# Patient Record
Sex: Female | Born: 1940 | Race: White | Hispanic: No | Smoking: Former smoker
Health system: Southern US, Community
[De-identification: ages and names within clinical notes are randomized; demographics above are authoritative.]

## PROBLEM LIST (undated history)

## (undated) DIAGNOSIS — Z8489 Family history of other specified conditions: Secondary | ICD-10-CM

## (undated) DIAGNOSIS — D649 Anemia, unspecified: Secondary | ICD-10-CM

## (undated) DIAGNOSIS — E7800 Pure hypercholesterolemia, unspecified: Secondary | ICD-10-CM

## (undated) DIAGNOSIS — I10 Essential (primary) hypertension: Secondary | ICD-10-CM

## (undated) DIAGNOSIS — K219 Gastro-esophageal reflux disease without esophagitis: Secondary | ICD-10-CM

## (undated) DIAGNOSIS — M810 Age-related osteoporosis without current pathological fracture: Secondary | ICD-10-CM

## (undated) DIAGNOSIS — F32A Depression, unspecified: Secondary | ICD-10-CM

## (undated) DIAGNOSIS — M1990 Unspecified osteoarthritis, unspecified site: Secondary | ICD-10-CM

## (undated) DIAGNOSIS — E039 Hypothyroidism, unspecified: Secondary | ICD-10-CM

## (undated) DIAGNOSIS — F329 Major depressive disorder, single episode, unspecified: Secondary | ICD-10-CM

## (undated) DIAGNOSIS — I341 Nonrheumatic mitral (valve) prolapse: Secondary | ICD-10-CM

## (undated) DIAGNOSIS — S72001A Fracture of unspecified part of neck of right femur, initial encounter for closed fracture: Secondary | ICD-10-CM

## (undated) HISTORY — PX: ABDOMINAL HYSTERECTOMY: SHX81

## (undated) HISTORY — PX: HERNIA REPAIR: SHX51

## (undated) HISTORY — PX: BREAST SURGERY: SHX581

## (undated) HISTORY — PX: COLONOSCOPY W/ BIOPSIES AND POLYPECTOMY: SHX1376

## (undated) HISTORY — PX: TONSILLECTOMY: SUR1361

---

## 1997-09-17 ENCOUNTER — Ambulatory Visit (HOSPITAL_COMMUNITY): Admission: RE | Admit: 1997-09-17 | Discharge: 1997-09-17 | Payer: Self-pay | Admitting: *Deleted

## 1999-05-03 ENCOUNTER — Encounter: Admission: RE | Admit: 1999-05-03 | Discharge: 1999-05-03 | Payer: Self-pay | Admitting: Obstetrics and Gynecology

## 2000-04-03 ENCOUNTER — Ambulatory Visit (HOSPITAL_COMMUNITY): Admission: RE | Admit: 2000-04-03 | Discharge: 2000-04-03 | Payer: Self-pay | Admitting: *Deleted

## 2001-04-18 ENCOUNTER — Encounter: Admission: RE | Admit: 2001-04-18 | Discharge: 2001-04-18 | Payer: Self-pay | Admitting: Internal Medicine

## 2001-04-18 ENCOUNTER — Encounter: Payer: Self-pay | Admitting: Internal Medicine

## 2001-08-28 ENCOUNTER — Other Ambulatory Visit: Admission: RE | Admit: 2001-08-28 | Discharge: 2001-08-28 | Payer: Self-pay | Admitting: Obstetrics and Gynecology

## 2002-10-06 ENCOUNTER — Encounter: Payer: Self-pay | Admitting: Obstetrics and Gynecology

## 2002-10-08 ENCOUNTER — Encounter (INDEPENDENT_AMBULATORY_CARE_PROVIDER_SITE_OTHER): Payer: Self-pay | Admitting: Specialist

## 2002-10-08 ENCOUNTER — Inpatient Hospital Stay (HOSPITAL_COMMUNITY): Admission: RE | Admit: 2002-10-08 | Discharge: 2002-10-10 | Payer: Self-pay | Admitting: Obstetrics and Gynecology

## 2003-05-25 ENCOUNTER — Ambulatory Visit (HOSPITAL_COMMUNITY): Admission: RE | Admit: 2003-05-25 | Discharge: 2003-05-25 | Payer: Self-pay | Admitting: Obstetrics and Gynecology

## 2009-02-07 ENCOUNTER — Encounter: Admission: RE | Admit: 2009-02-07 | Discharge: 2009-02-07 | Payer: Self-pay | Admitting: Neurological Surgery

## 2009-11-25 ENCOUNTER — Encounter: Admission: RE | Admit: 2009-11-25 | Discharge: 2009-11-25 | Payer: Self-pay | Admitting: Emergency Medicine

## 2010-12-08 NOTE — H&P (Signed)
NAME:  Paula Schultz, Paula Schultz NO.:  [id number]   MEDICAL RECORD NO.:  [id number]                   PATIENT TYPE:  INP   LOCATION:  NA                                   FACILITY:  [facility]   PHYSICIAN:  Malachi Pro. Ambrose Mantle, M.D.              DATE OF BIRTH:  13-Dec-1940   DATE OF ADMISSION:  DATE OF DISCHARGE:                                HISTORY & PHYSICAL   HISTORY OF PRESENT ILLNESS:  The patient is a 70 year old white female, Para  7, 0/7 admitted to the hospital for vaginal hysterectomy and  anterior/posterior repair because of pelvic relaxation that is symptomatic.  This patient is post menopausal. She stated that in February of 2004 that  for almost a year, she had felt a water balloon when her bladder was full  and when she is on her feet. She also had some stress urinary incontinence  with exercise, cough, and sneeze. On examination, she was noted to have a  third degree cystocele and second degree rectocele. She was informed of the  findings and wanted to proceed with vaginal hysterectomy and  anterior/posterior repair. She does not have insurance and she did not want  to proceed with a sling procedure.   ALLERGIES:  Reveals allergies to PENICILLIN.   PAST SURGICAL HISTORY:  She has had two hernia surgeries and one left breast  biopsy.   PAST MEDICAL HISTORY:  She has had no major adult illnesses although she  does have mitral valve prolapse, arthritis, and high blood pressure.   MEDICATIONS:  Include Effexor, Nexium, Univasc, Celebrex, HCTZ, and  Synthroid.   SOCIAL HISTORY:  She does not drink or smoke.   REVIEW OF SYSTEMS:  Noncontributory.   FAMILY HISTORY:  Father died at 78 years of heart problems. Mother died at  36 years of old age and high blood pressure. One brother and two sisters are  living and well.   PHYSICAL EXAMINATION:  GENERAL: A well developed, well nourished  white  female.  VITAL SIGNS: Weight 160 pounds. Blood pressure  144/76.  HEENT: No cranial abnormalities. Extraocular muscles intact. Nose and  pharynx clear.  NECK: Supple. Without thyromegaly.  HEART: Normal size and sounds. No murmurs.  LUNGS:  Clear to auscultation and percussion.  BREAST: Soft without masses.  ABDOMEN: Soft and nontender. No masses are palpable. Liver, spleen, and  kidney are not felt.  GU: The vulva and vagina are clean. There is a third degree cystocele that  protrudes at least to the introitus and maybe slightly beyond with  straining. There is a third degree rectocele. Cervix clean. The uterus is  thought to be posterior and small. For many years, the patient's uterus was  two times normal size and by ultrasound, showed two fibroids. But it feels  small at this point. The adnexa are free.  RECTAL: Examination is negative.   DIAGNOSTIC IMPRESSION:  PAP  smear on September 19, 2002 was within normal  limits.   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   PLAN:  The patient is admitted for vaginal hysterectomy, anterior/posterior  repair. She has been counseled about the risks of surgery including but not  limited to, heart attack, stroke, pulmonary embolism, wound disruption,  hemorrhage with need for re-operation and/or transfusion, fistula formation,  nerve injury, and intestinal obstruction. She understands and agrees to  proceed with surgery. She has been counseled that the surgery will shorten  and narrow her vagina. She has not been sexually active in many years.                                               Malachi Pro. Ambrose Mantle, M.D.    TFH/MEDQ  D:  10/07/2002  T:  10/08/2002  Job:  [id number]

## 2010-12-08 NOTE — Op Note (Signed)
NAME:  Paula Schultz, PARA NO.:  [id number]   MEDICAL RECORD NO.:  [id number]                   PATIENT TYPE:  INP   LOCATION:  X001                                 FACILITY:  [facility]   PHYSICIAN:  Malachi Pro. Ambrose Mantle, M.D.              DATE OF BIRTH:  1941-01-29   DATE OF PROCEDURE:  10/08/2002  DATE OF DISCHARGE:                                 OPERATIVE REPORT   PREOPERATIVE DIAGNOSIS:  Pelvic relaxation with cystocele and rectocele,  symptomatic.   POSTOPERATIVE DIAGNOSIS:  Pelvic relaxation with cystocele and rectocele,  symptomatic.   PROCEDURES:  1. Vaginal hysterectomy.  2. Anterior and posterior repair.   SURGEON:  Malachi Pro. Ambrose Mantle, M.D.   ASSISTANT:  Zenaida Niece, M.D.   ANESTHESIA:  General anesthesia.   DESCRIPTION OF PROCEDURE:  The patient was brought to the operating room and  placed under satisfactory general anesthesia.  He was placed in the  lithotomy position.  The vulva, vagina, perineum, and urethra were prepped  with Betadine solution.  Exam revealed the uterus to be retroverted, normal  size.  The adnexa were free of masses.  There was a third degree rectocele  and cystocele.  The area was draped as a sterile field after a Foley  catheter was inserted to straight drain.  The cervix was drawn into the  operative field with Lahey clamps.  A circumferential injection of a dilute  solution of Neo-Synephrine was made around the cervix.  A circumferential  incision was then made in this area.  The anterior vaginal mucosa was  separated from the cervix and then dissected anteriorly.  I identified the  anterior peritoneum, entered it, and retracted the bladder away.  I then  exposed the posterior cul-de-sac, entered the posterior cul-de-sac with  sharp dissection, clamped, cut, and suture ligated both uterosacral  ligaments, and held them.  I then clamped, cut, and suture ligated the  cardinal ligaments, paracervical tissues,  parametrial tissues, up to close  to the upper pedicles bilaterally.  The uterus was then inverted through the  incision in the cul-de-sac.  One clamp was placed across the remaining  tissue bilaterally.  The uterus was then removed.  I could see part of the  right ovary and did not see the left ovary.  There was no abnormality.  I  doubly suture ligated both upper pedicles and then ran the posterior vaginal  cuff with a suture of 0 Vicryl.  There was still one remaining bleeder that  was suture ligated.  At this point hemostasis appeared adequate.  I  reperitonealized the pelvis with a running suture of #1 Vicryl, including  the anterior peritoneum, the left upper pedicle, the left uterosacral  ligament, posterior cul-de-sac peritoneum, the right uterosacral ligament,  and the right upper pedicle.  I left this long and not tied.  I then placed  a suture through the uterosacral  ligaments bilaterally, incorporating the  posterior peritoneum with this suture and tied it down.  I then tied down  the pursestring suture, closing off the peritoneal cavity.  All sutures were  cut after the uterosacral ligaments were tied together in the midline.  I  dissected the anterior vaginal mucosa all the way to the urethral meatus,  developed the cystocele, placed a Kelly-type tissue through the periurethral  tissues at the urethrovesical angle, and then imbricated the cystocele with  multiple interrupted sutures of 0 Vicryl.  I then closed the anterior  vaginal mucosa after cutting away excessive vaginal mucosa and  reapproximated it in the midline with multiple interrupted figure-of-eight  sutures of 0 Vicryl.  The posterior vaginal mucosa was then cut across.  The  posterior vaginal mucosa was dissected away from the rectocele all the way  to the vaginal cuff.  I developed the rectocele, imbricated it in the  midline with multiple interrupted sutures of 0 Vicryl, cut away redundant  vaginal mucosa, and  closed the vaginal mucosa in the midline with multiple  interrupted figure-of-eight sutures of 0 Vicryl.  I did place a suture  through the levator muscles outside the introitus, tied this down, and then  closed the perineal tissue with a running 3-0 Vicryl suture.  Sponge and  needle counts were correct.  There was no bleeding, urine was clear, and the  patient returned to recovery in satisfactory condition.  EBL was estimated  as less than 200 by the nurse anesthetist.  I estimated it at 250.                                               Malachi Pro. Ambrose Mantle, M.D.    TFH/MEDQ  D:  10/08/2002  T:  10/08/2002  Job:  [id number]

## 2010-12-08 NOTE — Discharge Summary (Signed)
   NAME:  Paula Schultz, MOLDEN NO.:  [id number]   MEDICAL RECORD NO.:  [id number]                   PATIENT TYPE:  INP   LOCATION:  0440                                 FACILITY:  [facility]   PHYSICIAN:  Malachi Pro. Ambrose Mantle, M.D.              DATE OF BIRTH:  1941-07-06   DATE OF ADMISSION:  10/08/2002  DATE OF DISCHARGE:  10/10/2002                                 DISCHARGE SUMMARY   HISTORY OF PRESENT ILLNESS:  This is a 70 year old white female with pelvic  relaxation, cystocele, and rectocele admitted for vaginal hysterectomy, A&P  repair.   HOSPITAL COURSE:  The patient underwent that procedure by Dr. Ambrose Mantle with  Dr. Jackelyn Knife assisting under general anesthesia.  Blood loss was about 250  cc.  The vaginal pack was left at the end of the procedure, it was removed  on the morning of the first postoperative day.  The patient did well.  Her  catheter was removed on the first postoperative day.  She voided well  without difficulty, tolerated a regular diet, passed flatus, ambulated well  without difficulty, and was discharged on the second postoperative day.   LABORATORY DATA:  White blood cell count was 8900, hemoglobin 12.9,  hematocrit 38.3, platelet count 295,000.  Followup hematocrit was 34.4 and  31.5.  There were 80 segs, 13 lymphs, 7 monos.  Comprehensive metabolic  profile was normal.  Urinalysis was negative.  Chest x-ray showed no chest  findings, but a small hiatal hernia.  EKG showed a normal EKG.   FINAL DIAGNOSES:  Pelvic relaxation with symptomatic cystocele and  rectocele.   OPERATION:  Vaginal hysterectomy, A&P repair.   CONDITION ON DISCHARGE:  Improved.   DISCHARGE INSTRUCTIONS:  Our regular discharge instructions.  No vaginal  entrance, no heavy lifting, no strenuous activity.  Call with any  temperature elevation of greater then 100.4 degrees, call with any heavy  vaginal bleeding, call with any unusual problems.   The patient declines  analgesics at discharge.   FOLLOWUP:  The patient is advised to return to the office in two weeks for  followup examination.                                                Malachi Pro. Ambrose Mantle, M.D.    TFH/MEDQ  D:  10/10/2002  T:  10/10/2002  Job:  [id number]

## 2014-08-09 ENCOUNTER — Inpatient Hospital Stay (HOSPITAL_COMMUNITY): Payer: Commercial Managed Care - HMO

## 2014-08-09 ENCOUNTER — Inpatient Hospital Stay (HOSPITAL_COMMUNITY)
Admission: EM | Admit: 2014-08-09 | Discharge: 2014-08-13 | DRG: 481 | Disposition: A | Discharge status: Skilled Nursing Facility | Payer: Commercial Managed Care - HMO | Attending: Internal Medicine | Admitting: Internal Medicine

## 2014-08-09 ENCOUNTER — Encounter (HOSPITAL_COMMUNITY): Payer: Self-pay | Admitting: Emergency Medicine

## 2014-08-09 ENCOUNTER — Emergency Department (HOSPITAL_COMMUNITY): Payer: Commercial Managed Care - HMO

## 2014-08-09 DIAGNOSIS — F418 Other specified anxiety disorders: Secondary | ICD-10-CM | POA: Diagnosis present

## 2014-08-09 DIAGNOSIS — E780 Pure hypercholesterolemia: Secondary | ICD-10-CM | POA: Diagnosis present

## 2014-08-09 DIAGNOSIS — Z419 Encounter for procedure for purposes other than remedying health state, unspecified: Secondary | ICD-10-CM

## 2014-08-09 DIAGNOSIS — M549 Dorsalgia, unspecified: Secondary | ICD-10-CM

## 2014-08-09 DIAGNOSIS — M810 Age-related osteoporosis without current pathological fracture: Secondary | ICD-10-CM | POA: Diagnosis present

## 2014-08-09 DIAGNOSIS — M4856XA Collapsed vertebra, not elsewhere classified, lumbar region, initial encounter for fracture: Secondary | ICD-10-CM | POA: Diagnosis present

## 2014-08-09 DIAGNOSIS — E039 Hypothyroidism, unspecified: Secondary | ICD-10-CM | POA: Diagnosis present

## 2014-08-09 DIAGNOSIS — W1839XA Other fall on same level, initial encounter: Secondary | ICD-10-CM | POA: Diagnosis present

## 2014-08-09 DIAGNOSIS — N390 Urinary tract infection, site not specified: Secondary | ICD-10-CM | POA: Diagnosis present

## 2014-08-09 DIAGNOSIS — M4854XA Collapsed vertebra, not elsewhere classified, thoracic region, initial encounter for fracture: Secondary | ICD-10-CM | POA: Diagnosis present

## 2014-08-09 DIAGNOSIS — E876 Hypokalemia: Secondary | ICD-10-CM | POA: Diagnosis not present

## 2014-08-09 DIAGNOSIS — M79604 Pain in right leg: Secondary | ICD-10-CM

## 2014-08-09 DIAGNOSIS — S7221XA Displaced subtrochanteric fracture of right femur, initial encounter for closed fracture: Secondary | ICD-10-CM | POA: Diagnosis not present

## 2014-08-09 DIAGNOSIS — Z811 Family history of alcohol abuse and dependence: Secondary | ICD-10-CM | POA: Diagnosis not present

## 2014-08-09 DIAGNOSIS — E785 Hyperlipidemia, unspecified: Secondary | ICD-10-CM | POA: Insufficient documentation

## 2014-08-09 DIAGNOSIS — Y92019 Unspecified place in single-family (private) house as the place of occurrence of the external cause: Secondary | ICD-10-CM

## 2014-08-09 DIAGNOSIS — IMO0002 Reserved for concepts with insufficient information to code with codable children: Secondary | ICD-10-CM

## 2014-08-09 DIAGNOSIS — S72001A Fracture of unspecified part of neck of right femur, initial encounter for closed fracture: Secondary | ICD-10-CM | POA: Diagnosis present

## 2014-08-09 DIAGNOSIS — K219 Gastro-esophageal reflux disease without esophagitis: Secondary | ICD-10-CM | POA: Diagnosis present

## 2014-08-09 DIAGNOSIS — Z8249 Family history of ischemic heart disease and other diseases of the circulatory system: Secondary | ICD-10-CM | POA: Diagnosis not present

## 2014-08-09 DIAGNOSIS — I341 Nonrheumatic mitral (valve) prolapse: Secondary | ICD-10-CM | POA: Diagnosis present

## 2014-08-09 DIAGNOSIS — Z139 Encounter for screening, unspecified: Secondary | ICD-10-CM

## 2014-08-09 DIAGNOSIS — D62 Acute posthemorrhagic anemia: Secondary | ICD-10-CM | POA: Diagnosis not present

## 2014-08-09 DIAGNOSIS — I10 Essential (primary) hypertension: Secondary | ICD-10-CM | POA: Diagnosis present

## 2014-08-09 DIAGNOSIS — T148 Other injury of unspecified body region: Secondary | ICD-10-CM

## 2014-08-09 DIAGNOSIS — M25551 Pain in right hip: Secondary | ICD-10-CM | POA: Diagnosis present

## 2014-08-09 HISTORY — DX: Depression, unspecified: F32.A

## 2014-08-09 HISTORY — DX: Unspecified osteoarthritis, unspecified site: M19.90

## 2014-08-09 HISTORY — DX: Nonrheumatic mitral (valve) prolapse: I34.1

## 2014-08-09 HISTORY — DX: Hypothyroidism, unspecified: E03.9

## 2014-08-09 HISTORY — DX: Age-related osteoporosis without current pathological fracture: M81.0

## 2014-08-09 HISTORY — DX: Essential (primary) hypertension: I10

## 2014-08-09 HISTORY — DX: Gastro-esophageal reflux disease without esophagitis: K21.9

## 2014-08-09 HISTORY — DX: Major depressive disorder, single episode, unspecified: F32.9

## 2014-08-09 HISTORY — DX: Pure hypercholesterolemia, unspecified: E78.00

## 2014-08-09 LAB — CBC WITH DIFFERENTIAL/PLATELET
BASOS PCT: 0 % (ref 0–1)
Basophils Absolute: 0 10*3/uL (ref 0.0–0.1)
Eosinophils Absolute: 0.1 10*3/uL (ref 0.0–0.7)
Eosinophils Relative: 1 % (ref 0–5)
HCT: 39 % (ref 36.0–46.0)
Hemoglobin: 13.1 g/dL (ref 12.0–15.0)
Lymphocytes Relative: 6 % — ABNORMAL LOW (ref 12–46)
Lymphs Abs: 1 10*3/uL (ref 0.7–4.0)
MCH: 30.8 pg (ref 26.0–34.0)
MCHC: 33.6 g/dL (ref 30.0–36.0)
MCV: 91.5 fL (ref 78.0–100.0)
Monocytes Absolute: 0.7 10*3/uL (ref 0.1–1.0)
Monocytes Relative: 4 % (ref 3–12)
NEUTROS ABS: 15.9 10*3/uL — AB (ref 1.7–7.7)
NEUTROS PCT: 89 % — AB (ref 43–77)
Platelets: 260 10*3/uL (ref 150–400)
RBC: 4.26 MIL/uL (ref 3.87–5.11)
RDW: 13 % (ref 11.5–15.5)
WBC: 17.7 10*3/uL — ABNORMAL HIGH (ref 4.0–10.5)

## 2014-08-09 LAB — PROTIME-INR
INR: 1.01 (ref 0.00–1.49)
PROTHROMBIN TIME: 13.4 s (ref 11.6–15.2)

## 2014-08-09 LAB — BASIC METABOLIC PANEL
ANION GAP: 10 (ref 5–15)
BUN: 18 mg/dL (ref 6–23)
CHLORIDE: 105 meq/L (ref 96–112)
CO2: 23 mmol/L (ref 19–32)
Calcium: 9.2 mg/dL (ref 8.4–10.5)
Creatinine, Ser: 0.96 mg/dL (ref 0.50–1.10)
GFR calc Af Amer: 66 mL/min — ABNORMAL LOW (ref >90)
GFR calc non Af Amer: 57 mL/min — ABNORMAL LOW (ref >90)
GLUCOSE: 123 mg/dL — AB (ref 70–99)
POTASSIUM: 3.4 mmol/L — AB (ref 3.5–5.1)
Sodium: 138 mmol/L (ref 135–145)

## 2014-08-09 MED ORDER — ACETAMINOPHEN 650 MG RE SUPP: 650.0000 mg | Freq: Four times a day (QID) | RECTAL | Status: DC | PRN

## 2014-08-09 MED ORDER — EZETIMIBE 10 MG PO TABS
10.0000 mg | ORAL_TABLET | Freq: Every day | ORAL | Status: DC
  Administered 2014-08-11 – 2014-08-13 (×3): 10 mg via ORAL
  Filled 2014-08-09 (×4): qty 1

## 2014-08-09 MED ORDER — METOPROLOL TARTRATE 50 MG PO TABS
50.0000 mg | ORAL_TABLET | Freq: Every day | ORAL | Status: DC
  Administered 2014-08-10 – 2014-08-13 (×4): 50 mg via ORAL
  Filled 2014-08-09 (×4): qty 1

## 2014-08-09 MED ORDER — TRANDOLAPRIL 2 MG PO TABS
2.0000 mg | ORAL_TABLET | Freq: Every day | ORAL | Status: DC
  Administered 2014-08-11 – 2014-08-13 (×3): 2 mg via ORAL
  Filled 2014-08-09 (×4): qty 1

## 2014-08-09 MED ORDER — LORAZEPAM 0.5 MG PO TABS
0.2500 mg | ORAL_TABLET | Freq: Every day | ORAL | Status: DC | PRN
  Administered 2014-08-10 (×2): 0.5 mg via ORAL
  Filled 2014-08-09 (×2): qty 1

## 2014-08-09 MED ORDER — ONDANSETRON HCL 4 MG PO TABS: 4.0000 mg | ORAL_TABLET | Freq: Four times a day (QID) | ORAL | Status: DC | PRN

## 2014-08-09 MED ORDER — SENNA 8.6 MG PO TABS
1.0000 | ORAL_TABLET | Freq: Two times a day (BID) | ORAL | Status: DC
  Administered 2014-08-10 – 2014-08-12 (×5): 8.6 mg via ORAL
  Filled 2014-08-09 (×8): qty 1

## 2014-08-09 MED ORDER — HYDROMORPHONE HCL 1 MG/ML IJ SOLN
1.0000 mg | Freq: Once | INTRAMUSCULAR | Status: AC
  Administered 2014-08-09: 1 mg via INTRAVENOUS
  Filled 2014-08-09: qty 1

## 2014-08-09 MED ORDER — PANTOPRAZOLE SODIUM 40 MG PO TBEC
80.0000 mg | DELAYED_RELEASE_TABLET | Freq: Every day | ORAL | Status: DC
  Administered 2014-08-11 – 2014-08-12 (×2): 80 mg via ORAL
  Filled 2014-08-09 (×2): qty 2

## 2014-08-09 MED ORDER — POLYETHYLENE GLYCOL 3350 17 G PO PACK: 17.0000 g | PACK | Freq: Every day | ORAL | Status: DC | PRN

## 2014-08-09 MED ORDER — LEVOTHYROXINE SODIUM 100 MCG PO TABS
100.0000 ug | ORAL_TABLET | Freq: Every day | ORAL | Status: DC
  Administered 2014-08-11 – 2014-08-13 (×3): 100 ug via ORAL
  Filled 2014-08-09 (×5): qty 1

## 2014-08-09 MED ORDER — ACETAMINOPHEN 325 MG PO TABS: 650.0000 mg | ORAL_TABLET | Freq: Four times a day (QID) | ORAL | Status: DC | PRN

## 2014-08-09 MED ORDER — COLESEVELAM HCL 625 MG PO TABS
1875.0000 mg | ORAL_TABLET | Freq: Two times a day (BID) | ORAL | Status: DC
  Administered 2014-08-10 – 2014-08-13 (×6): 1875 mg via ORAL
  Filled 2014-08-09 (×9): qty 3

## 2014-08-09 MED ORDER — ONDANSETRON HCL 4 MG/2ML IJ SOLN: 4.0000 mg | Freq: Four times a day (QID) | INTRAMUSCULAR | Status: DC | PRN

## 2014-08-09 MED ORDER — MORPHINE SULFATE 2 MG/ML IJ SOLN
2.0000 mg | INTRAMUSCULAR | Status: DC | PRN
  Administered 2014-08-10 (×5): 2 mg via INTRAVENOUS
  Filled 2014-08-09 (×5): qty 1

## 2014-08-09 NOTE — ED Notes (Signed)
Gold necklace given to pt's daughter by Sprint Nextel CorporationVenus, EMT.

## 2014-08-09 NOTE — H&P (Addendum)
[facility] History and Physical  Paula Schultz BJY:[id number] DOB: [birth year] DOA: 08/09/2014  Referring physician: Dr. Essie Christine - [facility] PCP: No primary care provider on file.   Chief Complaint: Fall and R hip pain  HPI: Paula Schultz is a 74 y.o. female  18:45 today pt reports losing balance while ambulating in her home today. Denies hitting head. Fell directly onto the wood floor on R hip. Immediately painful. Unable to get up due to pain and R leg not funcitoning. Pain improved w/ IV p[ain medications in ED. Pain is non-radiating and constant. Denies syncope, HA, palpitations.   Review of Systems:  Constitutional:  No weight loss, night sweats, Fevers, chills, fatigue.  HEENT:  No headaches, Difficulty swallowing,Tooth/dental problems,Sore throat,  No sneezing, itching, ear ache, nasal congestion, post nasal drip,  Cardio-vascular:  No chest pain, Orthopnea, PND, swelling in lower extremities, anasarca, dizziness, palpitations  GI:  No heartburn, indigestion, abdominal pain, nausea, vomiting, diarrhea, change in bowel habits, loss of appetite  Resp:   No shortness of breath with exertion or at rest. No excess mucus, no productive cough, No non-productive cough, No coughing up of blood.No change in color of mucus.No wheezing.No chest wall deformity  Skin:  no rash or lesions.  GU:  no dysuria, change in color of urine, no urgency or frequency. No flank pain.  Musculoskeletal:  Per HPI Psych:  No change in mood or affect. No depression or anxiety. No memory loss.   Past Medical History  Diagnosis Date  . Hypertension   . Arthritis   . Osteoporosis   . Hypercholesteremia   . Mitral valve prolapse    No past surgical history on file. Social History:  reports that she has never smoked. She does not have any smokeless tobacco history on file. She reports that she does not drink alcohol or use illicit drugs.  Allergies  Allergen Reactions  . Penicillins     Family  History  Problem Relation Age of Onset  . AAA (abdominal aortic aneurysm) Mother   . Heart attack Father   . Alcohol abuse Father      Prior to Admission medications   Not on File   Physical Exam: Filed Vitals:   08/09/14 2039 08/09/14 2100 08/09/14 2305 08/09/14 2335  BP: 166/81 192/70  156/66  Pulse: 75 80 78 81  Temp: 97.8 F (36.6 C)  97.5 F (36.4 C) 97.6 F (36.4 C)  TempSrc: Oral  Oral   Resp: Height:      Weight:      SpO2: 92% 96% 94% 100%    Wt Readings from Last 3 Encounters:  08/09/14 64.864 kg (143 lb)    General: Appears to be in pain Eyes:  PERRL, normal lids, irises & conjunctiva ENT: Dry mm Neck:  no LAD, masses or thyromegaly Cardiovascular:  RRR, II/VI systolic murmur and mid systolic click. No LE edema. Telemetry:  SR, no arrhythmias  Respiratory:  CTA bilaterally, no w/r/r. Normal respiratory effort. Abdomen:  soft, ntnd Skin:  no rash or induration seen on limited exam Musculoskeletal:  Significant R lateral thigh ttp and swelling. 2+ distal pulses.  Psychiatric:  grossly normal mood and affect, speech fluent and appropriate Neurologic:  grossly non-focal.          Labs on Admission:  Basic Metabolic Panel:  Recent Labs Lab 08/09/14 2201  NA 138  K 3.4*  CL 105  CO2 23  GLUCOSE 123*  BUN 18  CREATININE 0.96  CALCIUM 9.2   Liver Function Tests: No results for input(s): AST, ALT, ALKPHOS, BILITOT, PROT, ALBUMIN in the last 168 hours. No results for input(s): LIPASE, AMYLASE in the last 168 hours. No results for input(s): AMMONIA in the last 168 hours. CBC:  Recent Labs Lab 08/09/14 2201  WBC 17.7*  NEUTROABS 15.9*  HGB 13.1  HCT 39.0  MCV 91.5  PLT 260   Cardiac Enzymes: No results for input(s): CKTOTAL, CKMB, CKMBINDEX, TROPONINI in the last 168 hours.  BNP (last 3 results) No results for input(s): PROBNP in the last 8760 hours. CBG: No results for input(s): GLUCAP in the last 168  hours.  Radiological Exams on Admission: Dg Chest 1 View  08/09/2014   CLINICAL DATA:  Preop for right proximal femur fracture ORIF. History of hypertension and mitral valve prolapse.  EXAM: CHEST - 1 VIEW  COMPARISON:  None.  FINDINGS: There is an opacity that projects over the cardiac silhouette bulging to the right of the right heart border. This is consistent with a large hiatal hernia.  Cardiac silhouette is normal size.  No hilar masses.  Mild linear/reticular opacity in the medial right lung base consistent with atelectasis and/ scarring. Lungs otherwise clear. No pleural effusion or pneumothorax.  Bony thorax is diffusely demineralized.  IMPRESSION: No acute cardiopulmonary disease.   Electronically Signed   By: Amie Portland M.D.   On: 08/09/2014 23:26   Dg Lumbar Spine 2-3 Views  08/09/2014   CLINICAL DATA:  Right hip pain, low back pain  EXAM: LUMBAR SPINE - 2-3 VIEW  COMPARISON:  02/07/2009  FINDINGS: There are 5 nonrib bearing lumbar-type vertebral bodies. There is an S-shaped scoliosis of the thoracolumbar spine.  There is a T11 vertebral body compression fracture with approximately 50% height loss. There is a T12 vertebral body compression fracture with approximately 40% height loss. There is a mild L1 vertebral body compression fracture with approximately 10% height loss.  There is no spondylolysis.  There is 6 mm of retrolisthesis of L2 on L3.  There is degenerative disc disease at L5-S1. There is degenerative disc disease at T11-12, T12-L1 and L1-2.  The SI joints are unremarkable.  IMPRESSION: 1. Age-indeterminate T11 vertebral body compression fracture with approximately 50% height loss. 2. Chronic T12 and L1 vertebral body compression fractures.   Electronically Signed   By: Elige Ko   On: 08/09/2014 22:05   Dg Pelvis 1-2 Views  08/09/2014   CLINICAL DATA:  Pt fell today pain rt hip and lower back  EXAM: PELVIS - 1-2 VIEW  COMPARISON:  02/07/2009  FINDINGS: There is a comminuted  displaced and angulated fracture of the proximal right femur. Fractures intertrochanteric with separate fracture fragments of the lesser and greater trochanters. There is prominent varus angulation. Fracture fragments are displaced is margins 2 cm.  No other fractures. No dislocation. Bones are demineralized. There is soft tissue swelling surrounding the fracture proximal right femur.  IMPRESSION: 1. Comminuted, displaced and angulated intertrochanteric fracture of the proximal right femur. No dislocation.   Electronically Signed   By: Amie Portland M.D.   On: 08/09/2014 21:58   Dg Knee 2 Views Right  08/09/2014   CLINICAL DATA:  Fall today with right leg pain.  Initial encounter.  EXAM: RIGHT KNEE - 1-2 VIEW  COMPARISON:  None.  FINDINGS: There is no evidence of fracture, dislocation, or joint effusion at the knee.  Knee osteoarthritis with mild marginal spurring. Joint narrowing is also mild.  IMPRESSION: No acute findings at the knee.   Electronically Signed   By: Tiburcio PeaJonathan  Watts M.D.   On: 08/09/2014 22:00   Dg Femur, Min 2 Views Right  08/09/2014   CLINICAL DATA:  Status post fall, right hip pain  EXAM: DG FEMUR 2+V*R*  COMPARISON:  None.  FINDINGS: There is a comminuted, angulated and displaced right intertrochanteric fracture. There is no hip dislocation. There is mild tricompartmental osteoarthritis of the right knee. There is no soft tissue abnormality.  IMPRESSION: Comminuted right intertrochanteric fracture without dislocation.   Electronically Signed   By: Elige KoHetal  Patel   On: 08/09/2014 21:58    EKG: ordered   Assessment/Plan Active Problems:   Closed right hip fracture   Essential hypertension   Osteoporosis   Compression fracture   HLD (hyperlipidemia)   Hypothyroidism   GERD (gastroesophageal reflux disease)  R hip fracture: sustained after mechanical fall. Dr Lajoyce Cornersuda consulted and planning for surgery in the am or possibly tonight.  - Admit - NPO  - CXR - EKG - f/u Ortho recs  post op - Morphine PRN pain - PT/OT post op  HTN: elevation likely from pain - continue metoprolol, moexipril,   GERD:  - continue ppi  Osteoporosis - hold home acteonel (Q wkly dosing)  Compression Fractures: Pt evaluated as outpt 2 days ago for lumbar pain from previous injury. They had not yet heard the results. Lumbar film on presentation w/ chronic and possible new fractures.  - intranasal calcitonin - PT/OT  HLD:  - continue Welchol, Zetia  Hypothyroidism:  - continue synthroid  Anxiety:  - continue Ativan PRN  Code Status: FULL DVT Prophylaxis: SCD Family Communication: Daughter Disposition Plan: pending surgical correction and rehab  Glenard Keesling Shela CommonsJ, MD Family Medicine [facility] [email or web address] Password [email or web address]

## 2014-08-09 NOTE — ED Notes (Signed)
Right leg swelling and deformity noted. Pt fell from standing position. 10 morphine and 150 fentanyl given EMS. Reports her leg gave out and wasn't wearing right shoes; no LOC; no head injury. Pulses intact. Lateral rotation. Intense swelling immediately. No pain to pelvis or lower leg.

## 2014-08-09 NOTE — ED Provider Notes (Signed)
CSN: [id number]     Arrival date & time 08/09/14  2028 History   First MD Initiated Contact with Patient 08/09/14 2037     Chief Complaint  Patient presents with  . Leg Injury     (Consider location/radiation/quality/duration/timing/severity/associated sxs/prior Treatment) HPI 74 year old female past medical history of osteoporosis who presents to ED with her daughter after patient fell this evening. Patient was reported to be standing and lost her balance and fell backward onto her bottom. She reports immediate pain to her right upper leg rated 10/10. EMS was called and according to them she was in intense pain and difficult to get into the stretcher as her leg was bent underneath of her. She denies having head injury, LOC, amnesia or pain elsewhere. Pain worse with movement or palpation. Patient [place] and was not seen for evaluation at that time. After having persistent low back pain she was seen by her primary doctor who ordered x-rays which have been done, however the results have not been relayed to them yet. No other complaints. Past Medical History  Diagnosis Date  . Hypertension   . Arthritis   . Osteoporosis   . Hypercholesteremia   . Mitral valve prolapse    No past surgical history on file. No family history on file. History  Substance Use Topics  . Smoking status: Not on file  . Smokeless tobacco: Not on file  . Alcohol Use: Not on file   OB History    No data available     Review of Systems  Constitutional: Negative for fever and chills.  HENT: Negative for congestion, rhinorrhea and sore throat.   Eyes: Negative for visual disturbance.  Respiratory: Negative for cough and shortness of breath.   Cardiovascular: Negative for chest pain, palpitations and leg swelling.  Gastrointestinal: Negative for nausea, vomiting, abdominal pain, diarrhea and constipation.  Genitourinary: Negative for dysuria, hematuria, vaginal bleeding and vaginal  discharge.  Musculoskeletal: Positive for back pain. Negative for neck pain.       R leg pain  Skin: Negative for rash.  Neurological: Negative for weakness and headaches.  All other systems reviewed and are negative.     Allergies  Penicillins  Home Medications   Prior to Admission medications   Not on File   BP 166/81 mmHg  Pulse 75  Temp(Src) 97.8 F (36.6 C) (Oral)  Resp 18  Ht  (1.626 m)  Wt 143 lb (64.864 kg)  BMI 24.53 kg/m2  SpO2 92% Physical Exam  Constitutional: She is oriented to person, place, and time. She appears well-developed and well-nourished. No distress.  HENT:  Head: Normocephalic and atraumatic.  Eyes: Conjunctivae are normal.  Neck: Normal range of motion.  Cardiovascular: Normal rate, regular rhythm, normal heart sounds and intact distal pulses.   No murmur heard. Pulmonary/Chest: Effort normal and breath sounds normal. No respiratory distress. She has no wheezes. She has no rales. She exhibits no tenderness.  Abdominal: Soft. Bowel sounds are normal. She exhibits no distension.  Musculoskeletal:       Right hip: She exhibits decreased range of motion, tenderness and bony tenderness. She exhibits no crepitus and no deformity.       Cervical back: She exhibits no tenderness and no bony tenderness.       Thoracic back: She exhibits no tenderness and no bony tenderness.       Lumbar back: She exhibits tenderness (mild paraspinal).  Leg length discrepancy noted on  R which is 1 inch short RLE - NVI distally with 2+ DP/TP  Neurological: She is alert and oriented to person, place, and time. No cranial nerve deficit.  Skin: Skin is warm and dry.  Psychiatric: She has a normal mood and affect.  Nursing note and vitals reviewed.   ED Course  Procedures (including critical care time) Labs Review Labs Reviewed  CBC WITH DIFFERENTIAL - Abnormal; Notable for the following:    WBC 17.7 (*)    Neutrophils Relative % 89 (*)    Neutro Abs 15.9 (*)     Lymphocytes Relative 6 (*)    All other components within normal limits  BASIC METABOLIC PANEL - Abnormal; Notable for the following:    Potassium 3.4 (*)    Glucose, Bld 123 (*)    GFR calc non Af Amer 57 (*)    GFR calc Af Amer 66 (*)    All other components within normal limits  PROTIME-INR  COMPREHENSIVE METABOLIC PANEL  CBC    Imaging Review Dg Chest 1 View  08/09/2014   CLINICAL DATA:  Preop for right proximal femur fracture ORIF. History of hypertension and mitral valve prolapse.  EXAM: CHEST - 1 VIEW  COMPARISON:  None.  FINDINGS: There is an opacity that projects over the cardiac silhouette bulging to the right of the right heart border. This is consistent with a large hiatal hernia.  Cardiac silhouette is normal size.  No hilar masses.  Mild linear/reticular opacity in the medial right lung base consistent with atelectasis and/ scarring. Lungs otherwise clear. No pleural effusion or pneumothorax.  Bony thorax is diffusely demineralized.  IMPRESSION: No acute cardiopulmonary disease.   Electronically Signed   By: Amie Portlandavid  Ormond M.D.   On: 08/09/2014 23:26   Dg Lumbar Spine 2-3 Views  08/09/2014   CLINICAL DATA:  Right hip pain, low back pain  EXAM: LUMBAR SPINE - 2-3 VIEW  COMPARISON:  02/07/2009  FINDINGS: There are 5 nonrib bearing lumbar-type vertebral bodies. There is an S-shaped scoliosis of the thoracolumbar spine.  There is a T11 vertebral body compression fracture with approximately 50% height loss. There is a T12 vertebral body compression fracture with approximately 40% height loss. There is a mild L1 vertebral body compression fracture with approximately 10% height loss.  There is no spondylolysis.  There is 6 mm of retrolisthesis of L2 on L3.  There is degenerative disc disease at L5-S1. There is degenerative disc disease at T11-12, T12-L1 and L1-2.  The SI joints are unremarkable.  IMPRESSION: 1. Age-indeterminate T11 vertebral body compression fracture with approximately  50% height loss. 2. Chronic T12 and L1 vertebral body compression fractures.   Electronically Signed   By: Elige KoHetal  Patel   On: 08/09/2014 22:05   Dg Pelvis 1-2 Views  08/09/2014   CLINICAL DATA:  Pt fell today pain rt hip and lower back  EXAM: PELVIS - 1-2 VIEW  COMPARISON:  02/07/2009  FINDINGS: There is a comminuted displaced and angulated fracture of the proximal right femur. Fractures intertrochanteric with separate fracture fragments of the lesser and greater trochanters. There is prominent varus angulation. Fracture fragments are displaced is margins 2 cm.  No other fractures. No dislocation. Bones are demineralized. There is soft tissue swelling surrounding the fracture proximal right femur.  IMPRESSION: 1. Comminuted, displaced and angulated intertrochanteric fracture of the proximal right femur. No dislocation.   Electronically Signed   By: Amie Portlandavid  Ormond M.D.   On: 08/09/2014 21:58   Dg Knee  2 Views Right  08/09/2014   CLINICAL DATA:  Fall today with right leg pain.  Initial encounter.  EXAM: RIGHT KNEE - 1-2 VIEW  COMPARISON:  None.  FINDINGS: There is no evidence of fracture, dislocation, or joint effusion at the knee.  Knee osteoarthritis with mild marginal spurring. Joint narrowing is also mild.  IMPRESSION: No acute findings at the knee.   Electronically Signed   By: Tiburcio Pea M.D.   On: 08/09/2014 22:00   Dg Femur, Min 2 Views Right  08/09/2014   CLINICAL DATA:  Status post fall, right hip pain  EXAM: DG FEMUR 2+V*R*  COMPARISON:  None.  FINDINGS: There is a comminuted, angulated and displaced right intertrochanteric fracture. There is no hip dislocation. There is mild tricompartmental osteoarthritis of the right knee. There is no soft tissue abnormality.  IMPRESSION: Comminuted right intertrochanteric fracture without dislocation.   Electronically Signed   By: Elige Ko   On: 08/09/2014 21:58     EKG Interpretation None      MDM   Final diagnoses:  Right leg pain    Pt  presents after mechanical fall. Secondary c/f R hip fx. Pt had xrays of L spine as outpt few days ago and no results yet. Mild paraspinal TTP on exam but no step off or other signs of trauma today. Plain films ordered of RLE and L spine. Pt has comm intertroch fx. Ortho c/s and pt is admitted to hospitalist.  Stable while in ED.  Clinical Impression: 1. Hip fracture, right, closed, initial encounter   2. Right leg pain   3. Back pain   4. Screening     Pt seen in conjunction with Dr. Rachel Moulds, DO [facility] Emergency Medicine Resident - PGY-2      Ames Dura, MD 08/10/14 1610  Gerhard Munch, MD 08/11/14 1208

## 2014-08-09 NOTE — ED Notes (Signed)
Pt returned from xray; pain assessed; vitals stable. Pt wants to wait for more pain meds; will use call bell for them.

## 2014-08-10 ENCOUNTER — Inpatient Hospital Stay (HOSPITAL_COMMUNITY): Payer: Commercial Managed Care - HMO | Admitting: Certified Registered Nurse Anesthetist

## 2014-08-10 ENCOUNTER — Inpatient Hospital Stay (HOSPITAL_COMMUNITY): Payer: Commercial Managed Care - HMO

## 2014-08-10 ENCOUNTER — Encounter (HOSPITAL_COMMUNITY)
Admission: EM | Disposition: A | Discharge status: Skilled Nursing Facility | Payer: Self-pay | Source: Home / Self Care | Attending: Internal Medicine

## 2014-08-10 ENCOUNTER — Encounter (HOSPITAL_COMMUNITY): Payer: Self-pay | Admitting: General Practice

## 2014-08-10 DIAGNOSIS — S72001A Fracture of unspecified part of neck of right femur, initial encounter for closed fracture: Secondary | ICD-10-CM

## 2014-08-10 HISTORY — PX: INTRAMEDULLARY (IM) NAIL INTERTROCHANTERIC: SHX5875

## 2014-08-10 LAB — COMPREHENSIVE METABOLIC PANEL
ALBUMIN: 3.4 g/dL — AB (ref 3.5–5.2)
ALT: 17 U/L (ref 0–35)
ANION GAP: 11 (ref 5–15)
AST: 24 U/L (ref 0–37)
Alkaline Phosphatase: 82 U/L (ref 39–117)
BUN: 15 mg/dL (ref 6–23)
CALCIUM: 9.3 mg/dL (ref 8.4–10.5)
CHLORIDE: 103 meq/L (ref 96–112)
CO2: 25 mmol/L (ref 19–32)
Creatinine, Ser: 0.75 mg/dL (ref 0.50–1.10)
GFR calc non Af Amer: 81 mL/min — ABNORMAL LOW (ref >90)
Glucose, Bld: 174 mg/dL — ABNORMAL HIGH (ref 70–99)
POTASSIUM: 3.4 mmol/L — AB (ref 3.5–5.1)
Sodium: 139 mmol/L (ref 135–145)
TOTAL PROTEIN: 6.8 g/dL (ref 6.0–8.3)
Total Bilirubin: 0.6 mg/dL (ref 0.3–1.2)

## 2014-08-10 LAB — CBC
HCT: 41 % (ref 36.0–46.0)
Hemoglobin: 13.7 g/dL (ref 12.0–15.0)
MCH: 30.5 pg (ref 26.0–34.0)
MCHC: 33.4 g/dL (ref 30.0–36.0)
MCV: 91.3 fL (ref 78.0–100.0)
Platelets: 271 10*3/uL (ref 150–400)
RBC: 4.49 MIL/uL (ref 3.87–5.11)
RDW: 12.9 % (ref 11.5–15.5)
WBC: 11.9 10*3/uL — AB (ref 4.0–10.5)

## 2014-08-10 LAB — SURGICAL PCR SCREEN
MRSA, PCR: NEGATIVE
Staphylococcus aureus: POSITIVE — AB

## 2014-08-10 SURGERY — FIXATION, FRACTURE, INTERTROCHANTERIC, WITH INTRAMEDULLARY ROD
Anesthesia: General | Laterality: Right

## 2014-08-10 MED ORDER — SENNOSIDES-DOCUSATE SODIUM 8.6-50 MG PO TABS: 1.0000 | ORAL_TABLET | Freq: Every evening | ORAL | Status: DC | PRN

## 2014-08-10 MED ORDER — ROCURONIUM BROMIDE 50 MG/5ML IV SOLN
INTRAVENOUS | Status: AC
  Filled 2014-08-10: qty 1

## 2014-08-10 MED ORDER — CALCITONIN (SALMON) 200 UNIT/ACT NA SOLN
1.0000 | Freq: Every day | NASAL | Status: DC
Start: 2014-08-10 — End: 2014-08-13
  Administered 2014-08-11 – 2014-08-13 (×3): 1 via NASAL
  Filled 2014-08-10: qty 3.7

## 2014-08-10 MED ORDER — ACETAMINOPHEN 500 MG PO TABS: 500.0000 mg | ORAL_TABLET | Freq: Four times a day (QID) | ORAL | Status: AC | PRN

## 2014-08-10 MED ORDER — MORPHINE SULFATE 2 MG/ML IJ SOLN: 0.5000 mg | INTRAMUSCULAR | Status: DC | PRN

## 2014-08-10 MED ORDER — EPHEDRINE SULFATE 50 MG/ML IJ SOLN
INTRAMUSCULAR | Status: AC
  Filled 2014-08-10: qty 1

## 2014-08-10 MED ORDER — FENTANYL CITRATE 0.05 MG/ML IJ SOLN
INTRAMUSCULAR | Status: DC | PRN
  Administered 2014-08-10 (×2): 50 ug via INTRAVENOUS

## 2014-08-10 MED ORDER — METOCLOPRAMIDE HCL 10 MG PO TABS: 5.0000 mg | ORAL_TABLET | Freq: Three times a day (TID) | ORAL | Status: DC | PRN

## 2014-08-10 MED ORDER — FENTANYL CITRATE 0.05 MG/ML IJ SOLN
INTRAMUSCULAR | Status: AC
  Filled 2014-08-10: qty 5

## 2014-08-10 MED ORDER — MIDAZOLAM HCL 5 MG/5ML IJ SOLN
INTRAMUSCULAR | Status: DC | PRN
  Administered 2014-08-10: 2 mg via INTRAVENOUS

## 2014-08-10 MED ORDER — PROPOFOL 10 MG/ML IV BOLUS
INTRAVENOUS | Status: AC
  Filled 2014-08-10: qty 20

## 2014-08-10 MED ORDER — HYDROCODONE-ACETAMINOPHEN 5-325 MG PO TABS
1.0000 | ORAL_TABLET | Freq: Four times a day (QID) | ORAL | Status: DC | PRN
  Administered 2014-08-11 – 2014-08-12 (×6): 2 via ORAL
  Filled 2014-08-10 (×6): qty 2

## 2014-08-10 MED ORDER — CLINDAMYCIN PHOSPHATE 600 MG/50ML IV SOLN
600.0000 mg | Freq: Four times a day (QID) | INTRAVENOUS | Status: AC
  Administered 2014-08-10 – 2014-08-11 (×2): 600 mg via INTRAVENOUS
  Filled 2014-08-10 (×2): qty 50

## 2014-08-10 MED ORDER — LIDOCAINE HCL (CARDIAC) 20 MG/ML IV SOLN
INTRAVENOUS | Status: AC
  Filled 2014-08-10: qty 5

## 2014-08-10 MED ORDER — LIDOCAINE HCL (CARDIAC) 20 MG/ML IV SOLN
INTRAVENOUS | Status: DC | PRN
  Administered 2014-08-10: 40 mg via INTRAVENOUS

## 2014-08-10 MED ORDER — EPHEDRINE SULFATE 50 MG/ML IJ SOLN
INTRAMUSCULAR | Status: DC | PRN
  Administered 2014-08-10 (×2): 10 mg via INTRAVENOUS

## 2014-08-10 MED ORDER — FENTANYL CITRATE 0.05 MG/ML IJ SOLN
25.0000 ug | INTRAMUSCULAR | Status: DC | PRN
  Administered 2014-08-10 (×2): 50 ug via INTRAVENOUS

## 2014-08-10 MED ORDER — DOCUSATE SODIUM 100 MG PO CAPS
100.0000 mg | ORAL_CAPSULE | Freq: Two times a day (BID) | ORAL | Status: DC
  Administered 2014-08-10 – 2014-08-12 (×5): 100 mg via ORAL
  Filled 2014-08-10 (×7): qty 1

## 2014-08-10 MED ORDER — ONDANSETRON HCL 4 MG/2ML IJ SOLN
INTRAMUSCULAR | Status: DC | PRN
  Administered 2014-08-10: 4 mg via INTRAVENOUS

## 2014-08-10 MED ORDER — NEOSTIGMINE METHYLSULFATE 10 MG/10ML IV SOLN
INTRAVENOUS | Status: DC | PRN
  Administered 2014-08-10: 3 mg via INTRAVENOUS

## 2014-08-10 MED ORDER — PHENYLEPHRINE HCL 10 MG/ML IJ SOLN
10.0000 mg | INTRAVENOUS | Status: DC | PRN
  Administered 2014-08-10: 2 ug/min via INTRAVENOUS

## 2014-08-10 MED ORDER — ONDANSETRON HCL 4 MG/2ML IJ SOLN: 4.0000 mg | Freq: Four times a day (QID) | INTRAMUSCULAR | Status: DC | PRN

## 2014-08-10 MED ORDER — ACETAMINOPHEN 325 MG PO TABS
650.0000 mg | ORAL_TABLET | Freq: Four times a day (QID) | ORAL | Status: DC | PRN
  Administered 2014-08-11: 650 mg via ORAL
  Filled 2014-08-10: qty 2

## 2014-08-10 MED ORDER — BISACODYL 5 MG PO TBEC
5.0000 mg | DELAYED_RELEASE_TABLET | Freq: Every day | ORAL | Status: DC | PRN
  Filled 2014-08-10: qty 1

## 2014-08-10 MED ORDER — ONDANSETRON HCL 4 MG/2ML IJ SOLN
INTRAMUSCULAR | Status: AC
  Filled 2014-08-10: qty 2

## 2014-08-10 MED ORDER — 0.9 % SODIUM CHLORIDE (POUR BTL) OPTIME
TOPICAL | Status: DC | PRN
  Administered 2014-08-10: 1000 mL

## 2014-08-10 MED ORDER — ONDANSETRON HCL 4 MG/2ML IJ SOLN: 4.0000 mg | Freq: Once | INTRAMUSCULAR | Status: DC | PRN

## 2014-08-10 MED ORDER — MORPHINE SULFATE 2 MG/ML IJ SOLN
1.0000 mg | Freq: Once | INTRAMUSCULAR | Status: AC
  Administered 2014-08-10: 1 mg via INTRAVENOUS
  Filled 2014-08-10: qty 1

## 2014-08-10 MED ORDER — MIDAZOLAM HCL 2 MG/2ML IJ SOLN
INTRAMUSCULAR | Status: AC
  Filled 2014-08-10: qty 2

## 2014-08-10 MED ORDER — MENTHOL 3 MG MT LOZG: 1.0000 | LOZENGE | OROMUCOSAL | Status: DC | PRN

## 2014-08-10 MED ORDER — ACETAMINOPHEN 650 MG RE SUPP: 650.0000 mg | Freq: Four times a day (QID) | RECTAL | Status: DC | PRN

## 2014-08-10 MED ORDER — CLINDAMYCIN PHOSPHATE 900 MG/50ML IV SOLN
INTRAVENOUS | Status: DC | PRN
  Administered 2014-08-10: 900 mg via INTRAVENOUS

## 2014-08-10 MED ORDER — LACTATED RINGERS IV SOLN
INTRAVENOUS | Status: DC
  Administered 2014-08-10 – 2014-08-12 (×3): via INTRAVENOUS

## 2014-08-10 MED ORDER — CHLORHEXIDINE GLUCONATE 4 % EX LIQD
60.0000 mL | Freq: Once | CUTANEOUS | Status: DC
  Filled 2014-08-10: qty 60

## 2014-08-10 MED ORDER — GLYCOPYRROLATE 0.2 MG/ML IJ SOLN
INTRAMUSCULAR | Status: DC | PRN
  Administered 2014-08-10: .4 mg via INTRAVENOUS

## 2014-08-10 MED ORDER — POTASSIUM CHLORIDE CRYS ER 20 MEQ PO TBCR: 40.0000 meq | EXTENDED_RELEASE_TABLET | Freq: Once | ORAL | Status: DC

## 2014-08-10 MED ORDER — SODIUM CHLORIDE 0.9 % IV SOLN
INTRAVENOUS | Status: DC
  Administered 2014-08-10 – 2014-08-11 (×2): via INTRAVENOUS

## 2014-08-10 MED ORDER — METOCLOPRAMIDE HCL 5 MG/ML IJ SOLN: 5.0000 mg | Freq: Three times a day (TID) | INTRAMUSCULAR | Status: DC | PRN

## 2014-08-10 MED ORDER — ASPIRIN EC 325 MG PO TBEC
325.0000 mg | DELAYED_RELEASE_TABLET | Freq: Every day | ORAL | Status: DC
  Administered 2014-08-11 – 2014-08-13 (×3): 325 mg via ORAL
  Filled 2014-08-10 (×4): qty 1

## 2014-08-10 MED ORDER — ROCURONIUM BROMIDE 100 MG/10ML IV SOLN
INTRAVENOUS | Status: DC | PRN
  Administered 2014-08-10: 25 mg via INTRAVENOUS

## 2014-08-10 MED ORDER — CLINDAMYCIN PHOSPHATE 900 MG/50ML IV SOLN
900.0000 mg | INTRAVENOUS | Status: DC
Start: 2014-08-10 — End: 2014-08-10
  Filled 2014-08-10: qty 50

## 2014-08-10 MED ORDER — PHENYLEPHRINE HCL 10 MG/ML IJ SOLN
INTRAMUSCULAR | Status: DC | PRN
  Administered 2014-08-10 (×8): 80 ug via INTRAVENOUS

## 2014-08-10 MED ORDER — LORAZEPAM 0.5 MG PO TABS
0.5000 mg | ORAL_TABLET | Freq: Four times a day (QID) | ORAL | Status: DC | PRN
  Administered 2014-08-11 – 2014-08-13 (×4): 0.5 mg via ORAL
  Filled 2014-08-10 (×5): qty 1

## 2014-08-10 MED ORDER — ONDANSETRON HCL 4 MG PO TABS: 4.0000 mg | ORAL_TABLET | Freq: Four times a day (QID) | ORAL | Status: DC | PRN

## 2014-08-10 MED ORDER — FENTANYL CITRATE 0.05 MG/ML IJ SOLN
INTRAMUSCULAR | Status: AC
  Administered 2014-08-10: 50 ug via INTRAVENOUS
  Filled 2014-08-10: qty 2

## 2014-08-10 MED ORDER — LACTATED RINGERS IV SOLN
INTRAVENOUS | Status: DC | PRN
  Administered 2014-08-10: 17:00:00 via INTRAVENOUS

## 2014-08-10 MED ORDER — PROPOFOL 10 MG/ML IV BOLUS
INTRAVENOUS | Status: DC | PRN
  Administered 2014-08-10: 100 mg via INTRAVENOUS

## 2014-08-10 MED ORDER — SODIUM CHLORIDE 0.9 % IJ SOLN
INTRAMUSCULAR | Status: AC
  Filled 2014-08-10: qty 10

## 2014-08-10 MED ORDER — ALBUMIN HUMAN 5 % IV SOLN
INTRAVENOUS | Status: DC | PRN
  Administered 2014-08-10: 18:00:00 via INTRAVENOUS

## 2014-08-10 MED ORDER — PHENOL 1.4 % MT LIQD: 1.0000 | OROMUCOSAL | Status: DC | PRN

## 2014-08-10 MED ORDER — ASPIRIN EC 325 MG PO TBEC: 325.0000 mg | DELAYED_RELEASE_TABLET | Freq: Every day | ORAL | Status: AC

## 2014-08-10 MED ORDER — MAGNESIUM CITRATE PO SOLN: 1.0000 | Freq: Once | ORAL | Status: AC | PRN

## 2014-08-10 MED ORDER — POTASSIUM CHLORIDE 10 MEQ/100ML IV SOLN
10.0000 meq | INTRAVENOUS | Status: AC
  Administered 2014-08-10 (×2): 10 meq via INTRAVENOUS
  Filled 2014-08-10 (×2): qty 100

## 2014-08-10 SURGICAL SUPPLY — 40 items
BLADE SURG 15 STRL LF DISP TIS (BLADE) ×1 IMPLANT
BLADE SURG 15 STRL SS (BLADE) ×3
BLADE TFNA HELICAL 80 (Anchor) ×1 IMPLANT
BLADE TFNA HELICAL 80MM (Anchor) ×1 IMPLANT
COVER PERINEAL POST (MISCELLANEOUS) ×3 IMPLANT
COVER SURGICAL LIGHT HANDLE (MISCELLANEOUS) ×3 IMPLANT
COVER TABLE BACK 60X90 (DRAPES) ×3 IMPLANT
DRAPE C-ARM 42X72 X-RAY (DRAPES) ×3 IMPLANT
DRAPE STERI IOBAN 125X83 (DRAPES) ×3 IMPLANT
DRSG ADAPTIC 3X8 NADH LF (GAUZE/BANDAGES/DRESSINGS) ×3 IMPLANT
DRSG MEPILEX BORDER 4X4 (GAUZE/BANDAGES/DRESSINGS) ×5 IMPLANT
DRSG MEPILEX BORDER 4X8 (GAUZE/BANDAGES/DRESSINGS) ×3 IMPLANT
ELECT REM PT RETURN 9FT ADLT (ELECTROSURGICAL) ×3
ELECTRODE REM PT RTRN 9FT ADLT (ELECTROSURGICAL) ×1 IMPLANT
EVACUATOR 1/8 PVC DRAIN (DRAIN) IMPLANT
GLOVE BIOGEL PI IND STRL 6.5 (GLOVE) IMPLANT
GLOVE BIOGEL PI IND STRL 7.5 (GLOVE) IMPLANT
GLOVE BIOGEL PI IND STRL 9 (GLOVE) ×1 IMPLANT
GLOVE BIOGEL PI INDICATOR 6.5 (GLOVE) ×2
GLOVE BIOGEL PI INDICATOR 7.5 (GLOVE) ×2
GLOVE BIOGEL PI INDICATOR 9 (GLOVE) ×2
GLOVE ECLIPSE 7.0 STRL STRAW (GLOVE) ×2 IMPLANT
GLOVE ORTHOPEDIC STR SZ6.5 (GLOVE) ×2 IMPLANT
GLOVE SURG ORTHO 9.0 STRL STRW (GLOVE) ×3 IMPLANT
GOWN STRL REUS W/ TWL XL LVL3 (GOWN DISPOSABLE) ×3 IMPLANT
GOWN STRL REUS W/TWL XL LVL3 (GOWN DISPOSABLE) ×9
KIT BASIN OR (CUSTOM PROCEDURE TRAY) ×3 IMPLANT
KIT ROOM TURNOVER OR (KITS) ×3 IMPLANT
LINER BOOT UNIVERSAL DISP (MISCELLANEOUS) ×3 IMPLANT
MANIFOLD NEPTUNE II (INSTRUMENTS) ×3 IMPLANT
NAIL CANN TFNA 9MM/130 DEG TI (Nail) ×2 IMPLANT
NS IRRIG 1000ML POUR BTL (IV SOLUTION) ×3 IMPLANT
PACK GENERAL/GYN (CUSTOM PROCEDURE TRAY) ×3 IMPLANT
PAD ARMBOARD 7.5X6 YLW CONV (MISCELLANEOUS) ×6 IMPLANT
SCREW LOCKING 5.0X34MM (Screw) ×2 IMPLANT
STAPLER VISISTAT 35W (STAPLE) ×2 IMPLANT
SUT VIC AB 2-0 CT1 27 (SUTURE) ×3
SUT VIC AB 2-0 CT1 TAPERPNT 27 (SUTURE) IMPLANT
SUT VIC AB 2-0 CTB1 (SUTURE) IMPLANT
WATER STERILE IRR 1000ML POUR (IV SOLUTION) ×6 IMPLANT

## 2014-08-10 NOTE — Anesthesia Preprocedure Evaluation (Signed)
Anesthesia Evaluation  Patient identified by MRN, date of birth, ID band Patient awake    Reviewed: Allergy & Precautions, NPO status , Patient's Chart, lab work & pertinent test results  Airway Mallampati: II   Neck ROM: full    Dental   Pulmonary former smoker,          Cardiovascular hypertension,     Neuro/Psych Depression    GI/Hepatic GERD-  ,  Endo/Other  Hypothyroidism   Renal/GU      Musculoskeletal  (+) Arthritis -,   Abdominal   Peds  Hematology   Anesthesia Other Findings   Reproductive/Obstetrics                             Anesthesia Physical Anesthesia Plan  ASA: II  Anesthesia Plan: General   Post-op Pain Management:    Induction: Intravenous  Airway Management Planned: Oral ETT  Additional Equipment:   Intra-op Plan:   Post-operative Plan: Extubation in OR  Informed Consent: I have reviewed the patients History and Physical, chart, labs and discussed the procedure including the risks, benefits and alternatives for the proposed anesthesia with the patient or authorized representative who has indicated his/her understanding and acceptance.     Plan Discussed with: CRNA, Anesthesiologist and Surgeon  Anesthesia Plan Comments:         Anesthesia Quick Evaluation

## 2014-08-10 NOTE — Anesthesia Postprocedure Evaluation (Signed)
Anesthesia Post Note  Patient: Paula CouncilJoanne C Whetzel  Procedure(s) Performed: Procedure(s) (LRB): INTRAMEDULLARY (IM) NAIL INTERTROCHANTRIC (Right)  Anesthesia type: General  Patient location: PACU  Post pain: Pain level controlled  Post assessment: Post-op Vital signs reviewed  Last Vitals: BP 148/47 mmHg  Pulse 105  Temp(Src) 36.4 C (Oral)  Resp 20  Ht 5\' 4"  (1.626 m)  Wt 143 lb (64.864 kg)  BMI 24.53 kg/m2  SpO2 96%  Post vital signs: Reviewed  Level of consciousness: sedated  Complications: No apparent anesthesia complications

## 2014-08-10 NOTE — Transfer of Care (Signed)
Immediate Anesthesia Transfer of Care Note  Patient: Paula Schultz  Procedure(s) Performed: Procedure(s): INTRAMEDULLARY (IM) NAIL INTERTROCHANTRIC (Right)  Patient Location: PACU  Anesthesia Type:General  Level of Consciousness: awake, alert  and oriented  Airway & Oxygen Therapy: Patient Spontanous Breathing  Post-op Assessment: Report given to PACU RN  Post vital signs: Reviewed and stable  Complications: No apparent anesthesia complications

## 2014-08-10 NOTE — Consult Note (Signed)
Reason for Consult: Right subtrochanteric hip fracture Referring Physician: ER physician  MARCAYLA BUDGE is an 74 y.o. female.  HPI: Patient is a 74 year old woman who lives at home who has had multiple falls and lumbar compression fractures who fell sustaining a subtrochanteric hip fracture on the right.  Past Medical History  Diagnosis Date  . Hypertension   . Arthritis   . Osteoporosis   . Hypercholesteremia   . Mitral valve prolapse     No past surgical history on file.  Family History  Problem Relation Age of Onset  . AAA (abdominal aortic aneurysm) Mother   . Heart attack Father   . Alcohol abuse Father     Social History:  reports that she has never smoked. She does not have any smokeless tobacco history on file. She reports that she does not drink alcohol or use illicit drugs.  Allergies:  Allergies  Allergen Reactions  . Penicillins     Medications: I have reviewed the patient's current medications.  Results for orders placed or performed during the hospital encounter of 08/09/14 (from the past 48 hour(s))  CBC with Differential     Status: Abnormal   Collection Time: 08/09/14 10:01 PM  Result Value Ref Range   WBC 17.7 (H) 4.0 - 10.5 K/uL   RBC 4.26 3.87 - 5.11 MIL/uL   Hemoglobin 13.1 12.0 - 15.0 g/dL   HCT 39.0 36.0 - 46.0 %   MCV 91.5 78.0 - 100.0 fL   MCH 30.8 26.0 - 34.0 pg   MCHC 33.6 30.0 - 36.0 g/dL   RDW 13.0 11.5 - 15.5 %   Platelets 260 150 - 400 K/uL   Neutrophils Relative % 89 (H) 43 - 77 %   Neutro Abs 15.9 (H) 1.7 - 7.7 K/uL   Lymphocytes Relative 6 (L) 12 - 46 %   Lymphs Abs 1.0 0.7 - 4.0 K/uL   Monocytes Relative 4 3 - 12 %   Monocytes Absolute 0.7 0.1 - 1.0 K/uL   Eosinophils Relative 1 0 - 5 %   Eosinophils Absolute 0.1 0.0 - 0.7 K/uL   Basophils Relative 0 0 - 1 %   Basophils Absolute 0.0 0.0 - 0.1 K/uL  Basic metabolic panel     Status: Abnormal   Collection Time: 08/09/14 10:01 PM  Result Value Ref Range   Sodium 138 135  - 145 mmol/L    Comment: Please note change in reference range.   Potassium 3.4 (L) 3.5 - 5.1 mmol/L    Comment: Please note change in reference range.   Chloride 105 96 - 112 mEq/L   CO2 23 19 - 32 mmol/L   Glucose, Bld 123 (H) 70 - 99 mg/dL   BUN 18 6 - 23 mg/dL   Creatinine, Ser 0.96 0.50 - 1.10 mg/dL   Calcium 9.2 8.4 - 10.5 mg/dL   GFR calc non Af Amer 57 (L) >90 mL/min   GFR calc Af Amer 66 (L) >90 mL/min    Comment: (NOTE) The eGFR has been calculated using the CKD EPI equation. This calculation has not been validated in all clinical situations. eGFR's persistently <90 mL/min signify possible Chronic Kidney Disease.    Anion gap 10 5 - 15  Protime-INR     Status: None   Collection Time: 08/09/14 10:21 PM  Result Value Ref Range   Prothrombin Time 13.4 11.6 - 15.2 seconds   INR 1.01 0.00 - 1.49  Comprehensive metabolic panel     Status:  Abnormal   Collection Time: 08/10/14  5:29 AM  Result Value Ref Range   Sodium 139 135 - 145 mmol/L    Comment: Please note change in reference range.   Potassium 3.4 (L) 3.5 - 5.1 mmol/L    Comment: Please note change in reference range.   Chloride 103 96 - 112 mEq/L   CO2 25 19 - 32 mmol/L   Glucose, Bld 174 (H) 70 - 99 mg/dL   BUN 15 6 - 23 mg/dL   Creatinine, Ser 0.75 0.50 - 1.10 mg/dL   Calcium 9.3 8.4 - 10.5 mg/dL   Total Protein 6.8 6.0 - 8.3 g/dL   Albumin 3.4 (L) 3.5 - 5.2 g/dL   AST 24 0 - 37 U/L   ALT 17 0 - 35 U/L   Alkaline Phosphatase 82 39 - 117 U/L   Total Bilirubin 0.6 0.3 - 1.2 mg/dL   GFR calc non Af Amer 81 (L) >90 mL/min   GFR calc Af Amer >90 >90 mL/min    Comment: (NOTE) The eGFR has been calculated using the CKD EPI equation. This calculation has not been validated in all clinical situations. eGFR's persistently <90 mL/min signify possible Chronic Kidney Disease.    Anion gap 11 5 - 15  CBC     Status: Abnormal   Collection Time: 08/10/14  5:29 AM  Result Value Ref Range   WBC 11.9 (H) 4.0 - 10.5  K/uL   RBC 4.49 3.87 - 5.11 MIL/uL   Hemoglobin 13.7 12.0 - 15.0 g/dL   HCT 41.0 36.0 - 46.0 %   MCV 91.3 78.0 - 100.0 fL   MCH 30.5 26.0 - 34.0 pg   MCHC 33.4 30.0 - 36.0 g/dL   RDW 12.9 11.5 - 15.5 %   Platelets 271 150 - 400 K/uL    Dg Chest 1 View  08/09/2014   CLINICAL DATA:  Preop for right proximal femur fracture ORIF. History of hypertension and mitral valve prolapse.  EXAM: CHEST - 1 VIEW  COMPARISON:  None.  FINDINGS: There is an opacity that projects over the cardiac silhouette bulging to the right of the right heart border. This is consistent with a large hiatal hernia.  Cardiac silhouette is normal size.  No hilar masses.  Mild linear/reticular opacity in the medial right lung base consistent with atelectasis and/ scarring. Lungs otherwise clear. No pleural effusion or pneumothorax.  Bony thorax is diffusely demineralized.  IMPRESSION: No acute cardiopulmonary disease.   Electronically Signed   By: Lajean Manes M.D.   On: 08/09/2014 23:26   Dg Lumbar Spine 2-3 Views  08/09/2014   CLINICAL DATA:  Right hip pain, low back pain  EXAM: LUMBAR SPINE - 2-3 VIEW  COMPARISON:  02/07/2009  FINDINGS: There are 5 nonrib bearing lumbar-type vertebral bodies. There is an S-shaped scoliosis of the thoracolumbar spine.  There is a T11 vertebral body compression fracture with approximately 50% height loss. There is a T12 vertebral body compression fracture with approximately 40% height loss. There is a mild L1 vertebral body compression fracture with approximately 10% height loss.  There is no spondylolysis.  There is 6 mm of retrolisthesis of L2 on L3.  There is degenerative disc disease at L5-S1. There is degenerative disc disease at T11-12, T12-L1 and L1-2.  The SI joints are unremarkable.  IMPRESSION: 1. Age-indeterminate T11 vertebral body compression fracture with approximately 50% height loss. 2. Chronic T12 and L1 vertebral body compression fractures.   Electronically Signed  By: Kathreen Devoid    On: 08/09/2014 22:05   Dg Pelvis 1-2 Views  08/09/2014   CLINICAL DATA:  Pt fell today pain rt hip and lower back  EXAM: PELVIS - 1-2 VIEW  COMPARISON:  02/07/2009  FINDINGS: There is a comminuted displaced and angulated fracture of the proximal right femur. Fractures intertrochanteric with separate fracture fragments of the lesser and greater trochanters. There is prominent varus angulation. Fracture fragments are displaced is margins 2 cm.  No other fractures. No dislocation. Bones are demineralized. There is soft tissue swelling surrounding the fracture proximal right femur.  IMPRESSION: 1. Comminuted, displaced and angulated intertrochanteric fracture of the proximal right femur. No dislocation.   Electronically Signed   By: Lajean Manes M.D.   On: 08/09/2014 21:58   Dg Knee 2 Views Right  08/09/2014   CLINICAL DATA:  Fall today with right leg pain.  Initial encounter.  EXAM: RIGHT KNEE - 1-2 VIEW  COMPARISON:  None.  FINDINGS: There is no evidence of fracture, dislocation, or joint effusion at the knee.  Knee osteoarthritis with mild marginal spurring. Joint narrowing is also mild.  IMPRESSION: No acute findings at the knee.   Electronically Signed   By: Jorje Guild M.D.   On: 08/09/2014 22:00   Dg Femur, Min 2 Views Right  08/09/2014   CLINICAL DATA:  Status post fall, right hip pain  EXAM: DG FEMUR 2+V*R*  COMPARISON:  None.  FINDINGS: There is a comminuted, angulated and displaced right intertrochanteric fracture. There is no hip dislocation. There is mild tricompartmental osteoarthritis of the right knee. There is no soft tissue abnormality.  IMPRESSION: Comminuted right intertrochanteric fracture without dislocation.   Electronically Signed   By: Kathreen Devoid   On: 08/09/2014 21:58    Review of Systems  All other systems reviewed and are negative.  Blood pressure 156/66, pulse 81, temperature 97.6 F (36.4 C), temperature source Oral, resp. rate 20, height 5' 4"  (1.626 m), weight  64.864 kg (143 lb), SpO2 100 %. Physical Exam  Examination patient's right lower extremity it is shortened and internally rotated. Radiographs shows a comminuted subtrochanteric hip fracture on the right. She also has multiple compression fractures of her thoracic and lumbar spine which by report are chronic. Assessment/Plan: Assessment: Comminuted subtrochanteric right hip fracture.  Plan: We will plan for intramedullary nail fixation of the right subtrochanteric hip fracture. Discussed with the patient and her daughter the need for skilled nursing placement postoperatively. Risks and benefits were discussed patient and her daughter state they understand and wish to proceed at this time.  DUDA,MARCUS V 08/10/2014, 6:34 AM

## 2014-08-10 NOTE — Progress Notes (Signed)
Patient ID: Paula Schultz, female   DOB: 08-19-1940, 74 y.o.   MRN: [id number]  [facility] PROGRESS NOTE  Paula Schultz JXB:[id number] DOB: 05-11-1941 DOA: 08/09/2014 PCP: Blane Ohara, MD   Brief narrative:    74 y.o. Female presented to [facility] ED after an episode of fall at home, hitting her right hip and subsequently unable to stand up and bear weight. In ED, XRAY of the right hip notable for fracture. Ortho consulted and [facility] asked to admit for further management.   Assessment/Plan:    Active Problems:   Closed right hip fracture - still with pain this AM - plan to take to OR today, appreciate ortho's team assistance  - continue to provide analgesia as needed    Hypokalemia - supplement via IV route and repeat BMP in AM   Leukocytosis - secondary to fall and fracture - no clear infectious etiology, no sings of PNA on CXR - WBC trending down since admission - CBC in AM   Essential hypertension - reasonable inpatient control  - continue metoprolol and trandolapril    Compression fractures in thoracic and lumbar spine - chronic in nature - will need PT evaluation once able to tolerate    HLD (hyperlipidemia) - continue ezetimibe    Hypothyroidism - continue synthroid    GERD (gastroesophageal reflux disease) - continue Protonix   Code Status: Full.  Family Communication:  plan of care discussed with the patient and daughter at bedside  Disposition Plan: Home when stable.   IV access:  Peripheral IV Procedures and diagnostic studies:     Dg Chest 1 View  08/09/2014  No acute cardiopulmonary disease.     Lumbar spine XRAY 08/09/2014  Age-indeterminate T11 compr fx with approximately 50% height loss. Chronic T12, L1 compr fx  Dg Pelvis 1-2 Views  08/09/2014  Comminuted, displaced and angulated intertrochanteric fracture of the proximal right femur.   Dg Knee 2 Views Right  08/09/2014  No acute findings at the knee.   Dg Femur, Min 2 Views Right  08/09/2014   Comminuted right intertrochanteric fracture without dislocation.   Medical Consultants:  Ortho  Other Consultants:  PT  IAnti-Infectives:   Pre op clindamycin   Debbora Presto, MD  [facility] Pager [phone]  If 7PM-7AM, please contact night-coverage [email or web address] Password [email or web address] 08/10/2014, 2:13 PM   LOS: 1 day   HPI/Subjective: No events overnight. With right hip area pain worse with movement.   Objective: Filed Vitals:   08/09/14 2100 08/09/14 2305 08/09/14 2335 08/10/14 0600  BP: 192/70  156/66 156/77  Pulse: 80 78 81 90  Temp:  97.5 F (36.4 C) 97.6 F (36.4 C) 97.6 F (36.4 C)  TempSrc:  Oral    Resp: Height:      Weight:      SpO2: 96% 94% 100% 98%   No intake or output data in the 24 hours ending 08/10/14 1413  Exam:   General:  Pt is alert, follows commands appropriately, not in acute distress  Cardiovascular: Regular rate and rhythm, no rubs, no gallops  Respiratory: Clear to auscultation bilaterally, no wheezing, no crackles, no rhonchi  Abdomen: Soft, non tender, non distended, bowel sounds present, no guarding  Data Reviewed: Basic Metabolic Panel:  Recent Labs Lab 08/09/14 2201 08/10/14 0529  NA 138 139  K 3.4* 3.4*  CL 105 103  CO2 23 25  GLUCOSE 123* 174*  BUN 18 15  CREATININE 0.96 0.75  CALCIUM 9.2 9.3  Liver Function Tests:  Recent Labs Lab 08/10/14 0529  AST 24  ALT 17  ALKPHOS 82  BILITOT 0.6  PROT 6.8  ALBUMIN 3.4*   CBC:  Recent Labs Lab 08/09/14 2201 08/10/14 0529  WBC 17.7* 11.9*  NEUTROABS 15.9*  --   HGB 13.1 13.7  HCT 39.0 41.0  MCV 91.5 91.3  PLT 260 271   Scheduled Meds: . calcitonin (salmon)  1 spray Alternating Nares Daily  . chlorhexidine  60 mL Topical Once  . clindamycin IV  900 mg Intravenous On Call to OR  . colesevelam  1,875 mg Oral BID  . ezetimibe  10 mg Oral Daily  . levothyroxine  100 mcg Oral QAC breakfast  . metoprolol  50 mg Oral Daily  . pantoprazole  80 mg Oral  Q1200  . senna  1 tablet Oral BID  . trandolapril  2 mg Oral Daily   Continuous Infusions:

## 2014-08-10 NOTE — Anesthesia Procedure Notes (Signed)
Procedure Name: Intubation Date/Time: 08/10/2014 5:57 PM Performed by: Dairl PonderJIANG, Lazar Tierce Pre-anesthesia Checklist: Patient identified, Emergency Drugs available, Suction available, Patient being monitored and Timeout performed Patient Re-evaluated:Patient Re-evaluated prior to inductionOxygen Delivery Method: Circle system utilized Preoxygenation: Pre-oxygenation with 100% oxygen Intubation Type: IV induction Ventilation: Mask ventilation without difficulty Laryngoscope Size: Mac and 3 Grade View: Grade I Tube type: Oral Tube size: 7.0 mm Number of attempts: 1 Placement Confirmation: ETT inserted through vocal cords under direct vision,  breath sounds checked- equal and bilateral and positive ETCO2 Secured at: 21 cm Tube secured with: Tape Dental Injury: Teeth and Oropharynx as per pre-operative assessment

## 2014-08-10 NOTE — Op Note (Signed)
DATE OF SURGERY:  08/10/2014  TIME: 6:32 PM  PATIENT NAME:  Paula Schultz     AGE: 74 y.o.  PRE-OPERATIVE DIAGNOSIS:  right sub troch nail  POST-OPERATIVE DIAGNOSIS:  SAME  PROCEDURE:  INTRAMEDULLARY (IM) NAIL INTERTROCHANTRIC 9 x 170 mm nail 80 mm spiral blade 34 mm locking screw  SURGEON:  Emmalie Haigh V  ASSISTANT:  none.  OPERATIVE IMPLANTS: Synthes trochanteric femoral nail with interlocking helical blade, locked proximally and distally.  PREOPERATIVE INDICATIONS:  Paula Schultz is a 74 y.o. year old who fell and suffered a hip fracture. She was brought into the ER and then admitted and optimized and then elected for surgical intervention.    The risks benefits and alternatives were discussed with the patient including but not limited to the risks of nonoperative treatment, versus surgical intervention including infection, bleeding, nerve injury, malunion, nonunion, hardware prominence, hardware failure, need for hardware removal, blood clots, cardiopulmonary complications, morbidity, mortality, among others, and they were willing to proceed.    OPERATIVE PROCEDURE:  The patient was brought to the operating room and placed in the supine position on the [place] fracture table. General anesthesia was administered. She was placed on the fracture table.  Closed reduction was performed under C-arm guidance. Time out was then performed after sterile prep with Duraprep and draped into a sterile field with the shower curtain. She received preoperative antibiotics.  Incision was made proximal to the greater trochanter. A guidewire was placed centered on the greater trochanter. Confirmation was made on AP and lateral views. The femoral canal was then reamed over the wire. Wire and reamer were removed and the nail was inserted.  A guidepin was placed through the jig into the femoral head close to the center center position of the femoral head. The helical screw length was measured the cortex was  reamed and the helical screw was inserted. C-arm was used to verify position of the spiral blade.  The compression screw proximally lock the spiral blade.  The distal interlock was placed using the jig.  Instruments were removed and final C-arm pictures AP and lateral were obtained.  The wounds were irrigated  and closed with Vicryl followed by staples and Mepilex dressing. The patient was awakened and returned to PACU in stable and satisfactory condition. There no complications. Weightbearing as tolerated.  Zaelyn Noack V

## 2014-08-10 NOTE — Progress Notes (Signed)
Utilization Review Completed.Alishah Schulte T1/19/2016  

## 2014-08-11 LAB — URINE MICROSCOPIC-ADD ON

## 2014-08-11 LAB — URINALYSIS, ROUTINE W REFLEX MICROSCOPIC
Bilirubin Urine: NEGATIVE
Glucose, UA: NEGATIVE mg/dL
Ketones, ur: NEGATIVE mg/dL
NITRITE: NEGATIVE
Protein, ur: 30 mg/dL — AB
SPECIFIC GRAVITY, URINE: 1.024 (ref 1.005–1.030)
UROBILINOGEN UA: 0.2 mg/dL (ref 0.0–1.0)
pH: 5 (ref 5.0–8.0)

## 2014-08-11 LAB — CBC
HCT: 27.7 % — ABNORMAL LOW (ref 36.0–46.0)
HEMOGLOBIN: 9.2 g/dL — AB (ref 12.0–15.0)
MCH: 30.3 pg (ref 26.0–34.0)
MCHC: 33.2 g/dL (ref 30.0–36.0)
MCV: 91.1 fL (ref 78.0–100.0)
PLATELETS: 236 10*3/uL (ref 150–400)
RBC: 3.04 MIL/uL — ABNORMAL LOW (ref 3.87–5.11)
RDW: 13.3 % (ref 11.5–15.5)
WBC: 11.1 10*3/uL — ABNORMAL HIGH (ref 4.0–10.5)

## 2014-08-11 LAB — BASIC METABOLIC PANEL
ANION GAP: 6 (ref 5–15)
BUN: 17 mg/dL (ref 6–23)
CO2: 26 mmol/L (ref 19–32)
Calcium: 8.1 mg/dL — ABNORMAL LOW (ref 8.4–10.5)
Chloride: 108 mEq/L (ref 96–112)
Creatinine, Ser: 1.06 mg/dL (ref 0.50–1.10)
GFR calc Af Amer: 58 mL/min — ABNORMAL LOW (ref >90)
GFR, EST NON AFRICAN AMERICAN: 50 mL/min — AB (ref >90)
Glucose, Bld: 135 mg/dL — ABNORMAL HIGH (ref 70–99)
POTASSIUM: 3.7 mmol/L (ref 3.5–5.1)
SODIUM: 140 mmol/L (ref 135–145)

## 2014-08-11 MED ORDER — ACETAMINOPHEN 325 MG PO TABS
650.0000 mg | ORAL_TABLET | ORAL | Status: DC | PRN
  Administered 2014-08-11: 650 mg via ORAL
  Filled 2014-08-11: qty 2

## 2014-08-11 MED ORDER — LEVOFLOXACIN 500 MG PO TABS
500.0000 mg | ORAL_TABLET | Freq: Every day | ORAL | Status: DC
  Administered 2014-08-11 – 2014-08-13 (×3): 500 mg via ORAL
  Filled 2014-08-11 (×3): qty 1

## 2014-08-11 MED ORDER — SODIUM CHLORIDE 0.9 % IV BOLUS (SEPSIS)
500.0000 mL | Freq: Once | INTRAVENOUS | Status: AC
  Administered 2014-08-11: 500 mL via INTRAVENOUS

## 2014-08-11 NOTE — Progress Notes (Signed)
Patient ID: Zara CouncilJoanne C Herberg, female   DOB: 09/14/1940, 74 y.o.   MRN: [id number] Postoperative day 1 intramedullary nail fixation right hip fracture. Plan to start physical therapy weightbearing as tolerated on the right. Plan for rehabilitation placement. Patient is alert oriented and comfortable this morning.

## 2014-08-11 NOTE — Evaluation (Signed)
Physical Therapy Evaluation Patient Details Name: Paula Schultz MRN: [id number] DOB: 08/27/1940 Today's Date: 08/11/2014   History of Present Illness  Pt adm after fall with rt hip fx. Underwent rt hip ORIF on 08/10/13. PMH- HTN, osteporosis, compression fx of back  Clinical Impression  Pt admitted with above diagnosis. Pt currently with functional limitations due to the deficits listed below (see PT Problem List).  Pt will benefit from skilled PT to increase their independence and safety with mobility to allow discharge to the venue listed below.       Follow Up Recommendations SNF    Equipment Recommendations  Rolling walker with 5" wheels    Recommendations for Other Services       Precautions / Restrictions Precautions Precautions: None Restrictions Weight Bearing Restrictions: Yes RLE Weight Bearing: Weight bearing as tolerated      Mobility  Bed Mobility Overal bed mobility: Needs Assistance Bed Mobility: Supine to Sit     Supine to sit: Mod assist     General bed mobility comments: Verbal cues for technique. Assist to move RLE and to elevate trunk and bring hips to EOB.  Transfers Overall transfer level: Needs assistance Equipment used: Rolling walker (2 wheeled) Transfers: Sit to/from Stand Sit to Stand: Min assist         General transfer comment: Verbal cues for hand placement and assist to bring hips up.  Ambulation/Gait Ambulation/Gait assistance: Min assist Ambulation Distance (Feet): 20 Feet Assistive device: Rolling walker (2 wheeled) Gait Pattern/deviations: Step-through pattern;Decreased step length - right;Decreased step length - left;Decreased stance time - right;Antalgic Gait velocity: decr Gait velocity interpretation: Below normal speed for age/gender    Stairs            Wheelchair Mobility    Modified Rankin (Stroke Patients Only)       Balance Overall balance assessment: Needs assistance;History of  Falls Sitting-balance support: No upper extremity supported;Feet supported Sitting balance-Leahy Scale: Fair     Standing balance support: Bilateral upper extremity supported Standing balance-Leahy Scale: Poor Standing balance comment: support of walker and min guard for static standing.                             Pertinent Vitals/Pain Pain Assessment: Faces Faces Pain Scale: Hurts even more Pain Location: rt hip Pain Descriptors / Indicators: Grimacing;Guarding Pain Intervention(s): Limited activity within patient's tolerance;Monitored during session;Premedicated before session;Repositioned;Ice applied    Home Living Family/patient expects to be discharged to:: Skilled nursing facility Living Arrangements: Children   Type of Home: House Home Access: Stairs to enter Entrance Stairs-Rails: None Entrance Stairs-Number of Steps: 1 Home Layout: One level Home Equipment: None Additional Comments: 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       Communication      Cognition Arousal/Alertness: Awake/alert Behavior During Therapy: WFL for tasks assessed/performed Overall Cognitive Status: Within Functional Limits for tasks assessed  General Comments      Exercises Total Joint Exercises Ankle Circles/Pumps: AROM;Both;10 reps Quad Sets: AROM;Both;10 reps;Supine Heel Slides: AAROM;Right;10 reps;Supine      Assessment/Plan    PT Assessment Patient needs continued PT services  PT Diagnosis Difficulty walking;Abnormality of  gait;Acute pain   PT Problem List Decreased strength;Decreased activity tolerance;Decreased balance;Decreased mobility;Decreased knowledge of use of DME;Pain  PT Treatment Interventions DME instruction;Gait training;Therapeutic activities;Functional mobility training;Therapeutic exercise;Balance training;Patient/family education   PT Goals (Current goals can be found in the Care Plan section) Acute Rehab PT Goals Patient Stated Goal: Return home PT Goal Formulation: With patient Time For Goal Achievement: 08/18/14 Potential to Achieve Goals: Good    Frequency Min 5X/week   Barriers to discharge Decreased caregiver support son has cerebral palsy    Co-evaluation               End of Session Equipment Utilized During Treatment: Gait belt Activity Tolerance: Patient tolerated treatment well Patient left: in chair;with call bell/phone within reach Nurse Communication: Mobility status         Time: 1610-9604 PT Time Calculation (min) (ACUTE ONLY): 30 min   Charges:   PT Evaluation $Initial PT Evaluation Tier I: 1 Procedure PT Treatments $Gait Training: 8-22 mins $Therapeutic Exercise: 8-22 mins   PT G Codes:        Ellese Julius 08/13/2014, 10:43 AM  Skip Mayer PT [phone]

## 2014-08-11 NOTE — Evaluation (Addendum)
Occupational Therapy Evaluation Patient Details Name: Paula UTECHT MRN: [id number] DOB: 02-Apr-1941 Today's Date: 08/11/2014    History of Present Illness Pt adm after fall with rt hip fx. Underwent rt hip ORIF on 08/10/13. PMH- HTN, osteporosis, compression fx of back   Clinical Impression   PTA pt lived at home and was independent with ADLs and cares for her son with CP. Pt currently limited by hip pain and decreased ROM which impair her independence with ADLs. Pt will benefit from ST Rehab at SNF prior to return home to progress to Mod I level. Pt will benefit from acute OT to address functional mobility and ADLs.     Follow Up Recommendations  SNF;Supervision/Assistance - 24 hour    Equipment Recommendations  3 in 1 bedside comode    Recommendations for Other Services       Precautions / Restrictions Precautions Precautions: None Restrictions Weight Bearing Restrictions: Yes RLE Weight Bearing: Weight bearing as tolerated      Mobility Bed Mobility Overal bed mobility: Needs Assistance Bed Mobility: Supine to Sit     Supine to sit: Mod assist     General bed mobility comments: (A) for LEs to EOB and hand held assist to pull/scoot to EOB.   Transfers Overall transfer level: Needs assistance Equipment used: Rolling walker (2 wheeled) Transfers: Sit to/from Stand Sit to Stand: Min assist         General transfer comment: Verbal cues for hand placement and assist to bring hips up.    Balance Overall balance assessment: Needs assistance Sitting-balance support: No upper extremity supported;Feet supported Sitting balance-Leahy Scale: Fair     Standing balance support: Bilateral upper extremity supported;During functional activity Standing balance-Leahy Scale: Poor Standing balance comment: Relies heavily on UE support on RW                            ADL Overall ADL's : Needs assistance/impaired Eating/Feeding: Independent;Sitting    Grooming: Set up;Sitting   Upper Body Bathing: Set up;Sitting   Lower Body Bathing: Moderate assistance;Sit to/from stand   Upper Body Dressing : Set up;Sitting   Lower Body Dressing: Maximal assistance;Sit to/from stand   Toilet Transfer: Minimal assistance;Ambulation;RW;BSC (BSC over toilet)   Toileting- Clothing Manipulation and Hygiene: Minimal assistance;Sit to/from stand       Functional mobility during ADLs: Min guard;Rolling walker General ADL Comments: Pt moving slowly and would benefit from youth RW due to stature to promote improved functional mobility.      Vision  Pt reports no change from baseline.                    Perception Perception Perception Tested?: No   Praxis Praxis Praxis tested?: Within functional limits         Extremity/Trunk Assessment Upper Extremity Assessment Upper Extremity Assessment: Overall WFL for tasks assessed   Lower Extremity Assessment Lower Extremity Assessment: Defer to PT evaluation   Cervical / Trunk Assessment Cervical / Trunk Assessment: Normal   Communication Communication Communication: No difficulties   Cognition Arousal/Alertness: Awake/alert Behavior During Therapy: WFL for tasks assessed/performed Overall Cognitive Status: Within Functional Limits for tasks assessed                                Home Living Family/patient expects to be discharged to:: Skilled nursing facility Living Arrangements: Children  Additional Comments: 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 (doesn't have to physically assist him)      Prior Functioning/Environment Level of Independence: Independent        Comments: frequent falls at home    OT Diagnosis: Generalized weakness;Acute pain   OT Problem List: Decreased strength;Decreased range of motion;Decreased activity tolerance;Impaired balance (sitting and/or standing);Decreased knowledge of use of DME or  AE;Decreased knowledge of precautions;Pain   OT Treatment/Interventions: Self-care/ADL training;Therapeutic exercise;Energy conservation;DME and/or AE instruction;Therapeutic activities;Patient/family education;Balance training    OT Goals(Current goals can be found in the care plan section) Acute Rehab OT Goals Patient Stated Goal: go [place]hab [place] then home OT Goal Formulation: With patient Time For Goal Achievement: 08/25/14 Potential to Achieve Goals: Good ADL Goals Pt Will Perform Grooming: with supervision;standing Pt Will Perform Lower Body Bathing: with supervision;sit to/from stand Pt Will Perform Lower Body Dressing: with supervision;sit to/from stand Pt Will Transfer to Toilet: with supervision;ambulating;bedside commode Pt Will Perform Toileting - Clothing Manipulation and hygiene: with supervision;sit to/from stand  OT Frequency: Min 2X/week    End of Session Equipment Utilized During Treatment: Gait belt;Rolling walker Nurse Communication: Mobility status;Other (comment) (pt attempted BM)  Activity Tolerance: Patient tolerated treatment well Patient left: in bed;with call bell/phone within reach   Time: 1510-1547 (5 minutes with pt spent on toilet) OT Time Calculation (min): 37 min Charges:  OT General Charges $OT Visit: 1 Procedure OT Evaluation $Initial OT Evaluation Tier I: 1 Procedure OT Treatments $Self Care/Home Management : 8-22 mins G-Codes:    Nena JordanMiller, Jacobe Study M 08/11/2014, 4:06 PM  Carney LivingLeeAnn Marie Avonda Toso, OTR/L Occupational Therapist [phone] (pager)

## 2014-08-11 NOTE — Progress Notes (Signed)
Pts heart rate noted to be sustained in 140s. Denied pain, SOB, and oral temp was 98.7. RR was 24. Rapid response nurse called, increased fluids, rectal temp 99.5. EKG showed sinus tach. NP on call notified received orders to increase fluids to 100 cc/hr, give 500 cc fluid bolus, and put pt on telemetry. Orders carried out, given Tylenol. Also spoke with Dr. Ophelia CharterYates who agreed with orders and stated to monitor urine output closely. Will continue to monitor.   Paula Schultz, [name]  08/11/2014

## 2014-08-11 NOTE — Clinical Social Work Psychosocial (Cosign Needed)
Clinical Social Work Department BRIEF PSYCHOSOCIAL ASSESSMENT 08/11/2014  Patient:  Paula Schultz,Domingue C     Account Number:  [id number]     Admit date:  08/09/2014  Clinical Social Worker:  Hortencia PilarWILEY,Adisyn Ruscitti, CLINICAL SOCIAL WORKER  Date/Time:  08/11/2014 03:19 PM  Referred by:  Physician  Date Referred:  08/11/2014 Referred for  SNF Placement   Other Referral:   none.   Interview type:  Patient Other interview type:   none.    PSYCHOSOCIAL DATA Living Status:  ALONE Admitted from facility:   Level of care:   Primary support name:  Delena Servearolyn Gross Primary support relationship to patient:  NONE Degree of support available:   Adequate Support.    CURRENT CONCERNS Current Concerns  Post-Acute Placement   Other Concerns:   none.    SOCIAL WORK ASSESSMENT / PLAN CSW and BSW-intern consulted regarding SNF placement for pt once medically stable for discharge. BSW intern spoke with pt to [facility] as pt's choice to further thearpy once medially stable for discharge.    Pt expressed no further questions at this time. CSW and BSW inten will continue to assist with discharge planning needs.   Assessment/plan status:  Psychosocial Support/Ongoing Assessment of Needs Other assessment/ plan:   none.   Information/referral to community resources:   [facility] once medically stable for discharge.    PATIENT'S/FAMILY'S RESPONSE TO PLAN OF CARE: Pt understanding and agreeable to CSW plan of care. Pt expressed no further questions or concerns at this time.       Claude MangesKierra S. Greyden Besecker, BSW-Intern

## 2014-08-11 NOTE — Progress Notes (Signed)
08/11/14 PT recommended SNF. Referral made to CSW. Will continue to follow for d/c needs. 

## 2014-08-11 NOTE — Clinical Social Work Placement (Signed)
Clinical Social Work Department CLINICAL SOCIAL WORK PLACEMENT NOTE 08/11/2014  Patient:  Zara CouncilFOSTER,Gionni C  Account Number:  [id number] Admit date:  08/09/2014  Clinical Social Worker:  Hortencia PilarKIERRA WILEY, CLINICAL SOCIAL WORKER  Date/time:  08/11/2014 03:42 PM  Clinical Social Work is seeking post-discharge placement for this patient at the following level of care:   SKILLED NURSING   (*CSW will update this form in Epic as items are completed)   08/11/2014  Patient/family provided with [facility] Department of Clinical Social Work's list of facilities offering this level of care within the geographic area requested by the patient (or if unable, by the patient's family).  08/11/2014  Patient/family informed of their freedom to choose among providers that offer the needed level of care, that participate in Medicare, Medicaid or managed care program needed by the patient, have an available bed and are willing to accept the patient.  08/11/2014  Patient/family informed of [facility]' ownership interest in [facility], as well as of the fact that they are under no obligation to receive care at this facility.  PASARR submitted to EDS on 08/11/2014 PASARR number received on 08/11/2014  FL2 transmitted to all facilities in geographic area requested by pt/family on  08/11/2014 FL2 transmitted to all facilities within larger geographic area on 08/11/2014  Patient informed that his/her managed care company has contracts with or will negotiate with  certain facilities, including the following:     Patient/family informed of bed offers received:  08/11/2014 Patient chooses bed at [facility] Physician recommends and patient chooses bed at  n/a  Patient [facility] on  08/13/2014 Patient to be transferred to facility by [facility] Patient and family notified of transfer on 08/12/2014 and 08/13/2014 Name of family member notified:  Patient is alert  and oriented.  CSW also spoke with daughter at length  The following physician request were entered in Epic:   Additional Comments:   Kierra S. Wiley, BSW-Intern

## 2014-08-11 NOTE — Significant Event (Signed)
Rapid Response Event Note Called per floor RN for patient with elevated HR S/P right hip surgery 1/19  Overview: Time Called: 2350 Arrival Time: 2357 Event Type: Cardiac  Initial Focused Assessment: Pt found resting in bed warm to touch. Lethargic but easy to arouse, denies pain and follows commands. Denies chest pain or SOB.  Pt UOP since surgery minimal, about 50 ml in urine bag. HR 110s BP 111/51 rr 20. Daughter at bedside.  Interventions: EKG obtained while RRT en route revealing ST. Rectal temp obtained yielding 99.5. Room cooled. [facility] on call NP paged per floor RN and Orthopedic MD on call paged as well to update. Orders obtained for fluid bolus and continuous tele monitoring for now. RN to monitor closely and notify myself and/or provider for worsening changes.   Event Summary: Name of Physician Notified: K.Schorr [facility] NP at 0015    at    Outcome: Stayed in room and stabalized     Paula Schultz, Paula Schultz Leigh Tanav Schultz

## 2014-08-11 NOTE — Progress Notes (Signed)
Patient ID: Paula Schultz, female   DOB: August 02, 1940, 74 y.o.   MRN: [id number]  [facility] PROGRESS NOTE  Paula Schultz KVQ:[id number] DOB: Jan 06, 1941 DOA: 08/09/2014 PCP: Blane Ohara, MD  HPI/Subjective: Had fever of 101 this morning, but was able to get up and walk around with physical therapy this morning.  Brief narrative:    74 y.o. Female presented to [facility] ED after an episode of fall at home, hitting her right hip and subsequently unable to stand up and bear weight. In ED, XRAY of the right hip notable for fracture. Ortho consulted and [facility] asked to admit for further management.   Assessment/Plan:    Closed right hip fracture -Presented with right hip pain after fall, x-rays showed right hip fracture. -Intertrochanteric fracture, internally fixed with subtrochanteric nail. -Continue PT/OT, pain control with narcotics. -DVT prophylaxis with Lovenox.    Hypokalemia -Repleted with IV supplements.    Leukocytosis/fever - secondary to fall and fracture - no clear infectious etiology, no sings of PNA on CXR - Patient developed fever of 100.1, check urinalysis, chest x-ray is negative. Start Levofloxacin.    Essential hypertension - reasonable inpatient control  - continue metoprolol and trandolapril     Compression fractures in thoracic and lumbar spine -Has age-indeterminate T11 compression fracture with approximately 50% height loss. 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    HLD (hyperlipidemia) - continue ezetimibe     Hypothyroidism - continue synthroid     GERD (gastroesophageal reflux disease) - continue Protonix   Code Status: Full.  Family Communication:  plan of care discussed with the patient and daughter at bedside  Disposition Plan: Home when stable.   IV access:  Peripheral IV Procedures and diagnostic studies:     Dg Chest 1 View  08/09/2014  No acute  cardiopulmonary disease.     Lumbar spine XRAY 08/09/2014  Age-indeterminate T11 compr fx with approximately 50% height loss. Chronic T12, L1 compr fx  Dg Pelvis 1-2 Views  08/09/2014  Comminuted, displaced and angulated intertrochanteric fracture of the proximal right femur.   Dg Knee 2 Views Right  08/09/2014  No acute findings at the knee.   Dg Femur, Min 2 Views Right  08/09/2014  Comminuted right intertrochanteric fracture without dislocation.   Medical Consultants:  Ortho  Other Consultants:  PT  IAnti-Infectives:   Pre op clindamycin   Clint Lipps, MD  [facility] Pager [phone]  If 7PM-7AM, please contact night-coverage [email or web address] Password [email or web address] 08/11/2014, 12:11 PM   LOS: 2 days     Objective: Filed Vitals:   08/11/14 0012 08/11/14 0156 08/11/14 0500 08/11/14 0800  BP:  117/49 105/42   Pulse:  90 101   Temp: 99.5 F (37.5 C) 97.6 F (36.4 C) 100.1 F (37.8 C)   TempSrc: Rectal  Rectal   Resp:  Height:      Weight:      SpO2:  98% 98% 98%    Intake/Output Summary (Last 24 hours) at 08/11/14 1211 Last data filed at 08/11/14 0700  Gross per 24 hour  Intake 2357.67 ml  Output    300 ml  Net 2057.67 ml    Exam:   General:  Pt is alert, follows commands appropriately, not in acute distress  Cardiovascular: Regular rate and rhythm, no rubs, no gallops  Respiratory: Clear to auscultation bilaterally, no wheezing, no crackles, no rhonchi  Abdomen: Soft, non tender, non  distended, bowel sounds present, no guarding  Data Reviewed: Basic Metabolic Panel:  Recent Labs Lab 08/09/14 2201 08/10/14 0529 08/11/14 0535  NA 138 139 140  K 3.4* 3.4* 3.7  CL 105 103 108  CO2 23 25 26   GLUCOSE 123* 174* 135*  BUN 18 15 17   CREATININE 0.96 0.75 1.06  CALCIUM 9.2 9.3 8.1*   Liver Function Tests:  Recent Labs Lab 08/10/14 0529  AST 24  ALT 17  ALKPHOS 82  BILITOT 0.6  PROT 6.8  ALBUMIN 3.4*   CBC:  Recent Labs Lab 08/09/14 2201  08/10/14 0529 08/11/14 0535  WBC 17.7* 11.9* 11.1*  NEUTROABS 15.9*  --   --   HGB 13.1 13.7 9.2*  HCT 39.0 41.0 27.7*  MCV 91.5 91.3 91.1  PLT 260 271 236   Scheduled Meds: . calcitonin (salmon)  1 spray Alternating Nares Daily  . chlorhexidine  60 mL Topical Once  . clindamycin IV  900 mg Intravenous On Call to OR  . colesevelam  1,875 mg Oral BID  . ezetimibe  10 mg Oral Daily  . levothyroxine  100 mcg Oral QAC breakfast  . metoprolol  50 mg Oral Daily  . pantoprazole  80 mg Oral Q1200  . senna  1 tablet Oral BID  . trandolapril  2 mg Oral Daily   Continuous Infusions: . sodium chloride 100 mL/hr at 08/11/14 0544  . lactated ringers 50 mL/hr at 08/10/14 1640

## 2014-08-12 ENCOUNTER — Encounter (HOSPITAL_COMMUNITY): Payer: Self-pay | Admitting: Orthopedic Surgery

## 2014-08-12 DIAGNOSIS — D62 Acute posthemorrhagic anemia: Secondary | ICD-10-CM | POA: Diagnosis not present

## 2014-08-12 DIAGNOSIS — N390 Urinary tract infection, site not specified: Secondary | ICD-10-CM | POA: Diagnosis present

## 2014-08-12 LAB — CBC
HEMATOCRIT: 24.3 % — AB (ref 36.0–46.0)
HEMATOCRIT: 23.5 % — AB (ref 36.0–46.0)
Hemoglobin: 7.9 g/dL — ABNORMAL LOW (ref 12.0–15.0)
Hemoglobin: 7.6 g/dL — ABNORMAL LOW (ref 12.0–15.0)
MCH: 30.2 pg (ref 26.0–34.0)
MCH: 29.8 pg (ref 26.0–34.0)
MCHC: 32.5 g/dL (ref 30.0–36.0)
MCHC: 32.3 g/dL (ref 30.0–36.0)
MCV: 92.7 fL (ref 78.0–100.0)
MCV: 92.2 fL (ref 78.0–100.0)
Platelets: 183 10*3/uL (ref 150–400)
Platelets: 167 10*3/uL (ref 150–400)
RBC: 2.62 MIL/uL — ABNORMAL LOW (ref 3.87–5.11)
RBC: 2.55 MIL/uL — AB (ref 3.87–5.11)
RDW: 13.6 % (ref 11.5–15.5)
RDW: 13.5 % (ref 11.5–15.5)
WBC: 9.5 10*3/uL (ref 4.0–10.5)
WBC: 10.2 10*3/uL (ref 4.0–10.5)

## 2014-08-12 LAB — BASIC METABOLIC PANEL
Anion gap: 5 (ref 5–15)
BUN: 13 mg/dL (ref 6–23)
CALCIUM: 8.1 mg/dL — AB (ref 8.4–10.5)
CHLORIDE: 106 meq/L (ref 96–112)
CO2: 29 mmol/L (ref 19–32)
Creatinine, Ser: 0.83 mg/dL (ref 0.50–1.10)
GFR calc Af Amer: 79 mL/min — ABNORMAL LOW (ref >90)
GFR, EST NON AFRICAN AMERICAN: 68 mL/min — AB (ref >90)
GLUCOSE: 111 mg/dL — AB (ref 70–99)
Potassium: 3.7 mmol/L (ref 3.5–5.1)
Sodium: 140 mmol/L (ref 135–145)

## 2014-08-12 LAB — PREPARE RBC (CROSSMATCH)

## 2014-08-12 LAB — ABO/RH: ABO/RH(D): O POS

## 2014-08-12 MED ORDER — FUROSEMIDE 10 MG/ML IJ SOLN: 20.0000 mg | Freq: Once | INTRAMUSCULAR | Status: DC

## 2014-08-12 MED ORDER — WHITE PETROLATUM GEL
Status: AC
  Administered 2014-08-12: 1
  Filled 2014-08-12: qty 1

## 2014-08-12 MED ORDER — FUROSEMIDE 10 MG/ML IJ SOLN
40.0000 mg | Freq: Once | INTRAMUSCULAR | Status: AC
  Administered 2014-08-12: 40 mg via INTRAVENOUS
  Filled 2014-08-12: qty 4

## 2014-08-12 MED ORDER — SODIUM CHLORIDE 0.9 % IV SOLN
Freq: Once | INTRAVENOUS | Status: AC
  Administered 2014-08-12: 09:00:00 via INTRAVENOUS

## 2014-08-12 NOTE — Clinical Social Work Note (Addendum)
Patient has bed available at [facility] when medically stable.  [employer] authorization has been received by [facility].  Projected discharge: Friday 08/13/2014  Physician: please sign FL2   CSW spoke with patient's daughter who is very tearful re: placement.  Patient daughter feels guilt due to her inability to care for patient 24/7 upon dc.  CSW offered support.  Patient's daughter is unable to provide financial assistance for private duty nursing.  All other family members are employed.  The patient's daughter cannot think of another plan of supervision for patient to safely return home.  Patient's daughter remains tearful, but is understandable and agreeable.  Vickii PennaGina Patrizia Paule, LCSWA [phone]  Psychiatric & Orthopedics (5N 1-16) Clinical Social Worker

## 2014-08-12 NOTE — Progress Notes (Signed)
Patient ID: Paula Schultz, female   DOB: [birth year], 74 y.o.   MRN: [id number]  [facility] PROGRESS NOTE  Paula Schultz ION:[id number] DOB: [birth year] DOA: 08/09/2014 PCP: Blane Ohara, MD  HPI/Subjective: Feels better, patient hemoglobin dropped to 7.6. SNF likely tomorrow.  Brief narrative:    74 y.o. Female presented to [facility] ED after an episode of fall at home, hitting her right hip and subsequently unable to stand up and bear weight. In ED, XRAY of the right hip notable for fracture. Ortho consulted and [facility] asked to admit for further management.   Assessment/Plan:   Principal Problem:   Closed right hip fracture Active Problems:   Essential hypertension   Osteoporosis   Compression fracture   Hypothyroidism   GERD (gastroesophageal reflux disease)   UTI (lower urinary tract infection)   Acute blood loss anemia   Closed right hip fracture -Presented with right hip pain after fall, x-rays showed right hip fracture. -Intertrochanteric fracture, internally fixed with subtrochanteric nail. -Continue PT/OT, pain control with narcotics. -DVT prophylaxis with Lovenox.  Acute blood loss anemia -Hemoglobin dropped to 7.6, patient presented with hemoglobin of 13.7. -Check the right hip, slight nonsignificant bruising, no evidence of hepatoma. -Transfuse 2 units of packed RBCs, give Lasix in between.  UTI -Has leukocytosis and fever, started on levofloxacin. -Urinalysis consistent with UTI, await urine culture to adjust antibiotics.  Hypokalemia -Repleted with IV supplements.    Leukocytosis/fever - secondary to fall and fracture - no clear infectious etiology, no sings of PNA on CXR - Patient developed fever of 100.1, check urinalysis, chest x-ray is negative. Start Levofloxacin.    Essential hypertension - reasonable inpatient control  - continue metoprolol and trandolapril     Compression fractures in thoracic and lumbar spine -Has age-indeterminate T11  compression fracture with approximately 50% height loss. 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    HLD (hyperlipidemia) - continue ezetimibe     Hypothyroidism - continue synthroid     GERD (gastroesophageal reflux disease) - continue Protonix   Code Status: Full.  Family Communication:  plan of care discussed with the patient and daughter at bedside  Disposition Plan: SNF likely tomorrow.  IV access:  Peripheral IV Procedures and diagnostic studies:     Dg Chest 1 View  08/09/2014  No acute cardiopulmonary disease.     Lumbar spine XRAY 08/09/2014  Age-indeterminate T11 compr fx with approximately 50% height loss. Chronic T12, L1 compr fx  Dg Pelvis 1-2 Views  08/09/2014  Comminuted, displaced and angulated intertrochanteric fracture of the proximal right femur.   Dg Knee 2 Views Right  08/09/2014  No acute findings at the knee.   Dg Femur, Min 2 Views Right  08/09/2014  Comminuted right intertrochanteric fracture without dislocation.   Medical Consultants:  Ortho  Other Consultants:  PT  IAnti-Infectives:   Pre op clindamycin   Clint Lipps, MD  [facility] Pager [phone]  If 7PM-7AM, please contact night-coverage [email or web address] Password [email or web address] 08/12/2014, 9:28 AM   LOS: 3 days     Objective: Filed Vitals:   08/11/14 1600 08/11/14 2123 08/12/14 0521 08/12/14 0800  BP:  105/44 118/56   Pulse:  95 91   Temp:  98.4 F (36.9 C) 98.1 F (36.7 C)   TempSrc:   Oral   Resp: Height:      Weight:      SpO2:  96% 97%  Intake/Output Summary (Last 24 hours) at 08/12/14 0928 Last data filed at 08/12/14 0700  Gross per 24 hour  Intake    600 ml  Output    500 ml  Net    100 ml    Exam:   General:  Pt is alert, follows commands appropriately, not in acute distress  Cardiovascular: Regular rate and rhythm, no rubs, no gallops  Respiratory: Clear to auscultation  bilaterally, no wheezing, no crackles, no rhonchi  Abdomen: Soft, non tender, non distended, bowel sounds present, no guarding  Data Reviewed: Basic Metabolic Panel:  Recent Labs Lab 08/09/14 2201 08/10/14 0529 08/11/14 0535 08/12/14 0610  NA 138 139 140 140  K 3.4* 3.4* 3.7 3.7  CL 105 103 108 106  CO2 23 25 26 29   GLUCOSE 123* 174* 135* 111*  BUN 18 15 17 13   CREATININE 0.96 0.75 1.06 0.83  CALCIUM 9.2 9.3 8.1* 8.1*   Liver Function Tests:  Recent Labs Lab 08/10/14 0529  AST 24  ALT 17  ALKPHOS 82  BILITOT 0.6  PROT 6.8  ALBUMIN 3.4*   CBC:  Recent Labs Lab 08/09/14 2201 08/10/14 0529 08/11/14 0535 08/12/14 0610 08/12/14 0757  WBC 17.7* 11.9* 11.1* 9.5 10.2  NEUTROABS 15.9*  --   --   --   --   HGB 13.1 13.7 9.2* 7.9* 7.6*  HCT 39.0 41.0 27.7* 24.3* 23.5*  MCV 91.5 91.3 91.1 92.7 92.2  PLT 260 271 236 183 167   Scheduled Meds: . calcitonin (salmon)  1 spray Alternating Nares Daily  . chlorhexidine  60 mL Topical Once  . clindamycin IV  900 mg Intravenous On Call to OR  . colesevelam  1,875 mg Oral BID  . ezetimibe  10 mg Oral Daily  . levothyroxine  100 mcg Oral QAC breakfast  . metoprolol  50 mg Oral Daily  . pantoprazole  80 mg Oral Q1200  . senna  1 tablet Oral BID  . trandolapril  2 mg Oral Daily   Continuous Infusions: . lactated ringers 50 mL/hr at 08/11/14 2009

## 2014-08-12 NOTE — Progress Notes (Signed)
Physical Therapy Treatment Patient Details Name: Paula Schultz MRN: [id number] DOB: 23-Jun-1941 Today's Date: 08/12/2014    History of Present Illness Pt adm after fall with rt hip fx. Underwent rt hip ORIF on 08/10/13. PMH- HTN, osteporosis, compression fx of back    PT Comments    Pt is progressing well, although slowly, with her mobility.  She was able to ambulate into the hallway and preform her right hip exercises.  She is getting 2 units of PRBCs (one hanging during our session) today due to low Hgb, but generally seems to be asymptomatic (no lightheadedness or excessive weakness).  PT will continue to follow acutely until d/c to SNF for rehab.    Follow Up Recommendations  SNF     Equipment Recommendations  Rolling walker with 5" wheels    Recommendations for Other Services   NA     Precautions / Restrictions Precautions Precautions: None Restrictions RLE Weight Bearing: Weight bearing as tolerated    Mobility  Bed Mobility Overal bed mobility: Needs Assistance Bed Mobility: Supine to Sit     Supine to sit: Mod assist;+2 for physical assistance     General bed mobility comments: Two person mod assist to help support trunk to get to sitting EOB. Pt was min assist to progress her right leg over to EOB after preforming "warm up" exercises.   Transfers Overall transfer level: Needs assistance Equipment used: Rolling walker (2 wheeled) Transfers: Sit to/from Stand Sit to Stand: Min assist         General transfer comment: Verbal cues for safe hand placement and support needed to help trunk power up to standing and help control descent to sit in lower chair.   Ambulation/Gait Ambulation/Gait assistance: Min assist Ambulation Distance (Feet): 65 Feet Assistive device: Rolling walker (2 wheeled) Gait Pattern/deviations: Step-to pattern;Antalgic;Trunk flexed Gait velocity: decreased Gait velocity interpretation: Below normal speed for age/gender General Gait  Details: Pt with moderately antalgic gait pattern which did improve with increased gait distance.  Verbal cues for upright posture and correct LE sequencing in the RW.        Balance Overall balance assessment: Needs assistance Sitting-balance support: Feet supported;No upper extremity supported Sitting balance-Leahy Scale: Good     Standing balance support: Bilateral upper extremity supported Standing balance-Leahy Scale: Poor                      Cognition Arousal/Alertness: Awake/alert Behavior During Therapy: WFL for tasks assessed/performed Overall Cognitive Status: Within Functional Limits for tasks assessed                      Exercises Total Joint Exercises Ankle Circles/Pumps: AROM;Both;10 reps;Supine Quad Sets: AROM;Both;10 reps;Supine Short Arc Quad: AROM;Right;10 reps Heel Slides: AAROM;Right;10 reps;Supine Hip ABduction/ADduction: AAROM;Right;10 reps;Supine        Pertinent Vitals/Pain Pain Assessment: Faces Faces Pain Scale: Hurts little more Pain Location: right leg and hip Pain Descriptors / Indicators: Aching;Burning Pain Intervention(s): Limited activity within patient's tolerance;Monitored during session;Repositioned           PT Goals (current goals can now be found in the care plan section) Acute Rehab PT Goals Patient Stated Goal: go to rehab near [place] then home Progress towards PT goals: Progressing toward goals    Frequency  Min 5X/week    PT Plan Current plan remains appropriate       End of Session   Activity Tolerance: Patient limited by fatigue;Patient limited by pain Patient  left: in chair;with call bell/phone within reach     Time: 1407-1440 PT Time Calculation (min) (ACUTE ONLY): 33 min  Charges:  $Gait Training: 8-22 mins $Therapeutic Exercise: 8-22 mins                      Arshi Duarte B. Tellis Spivak, PT, DPT [phone]   08/12/2014, 3:43 PM

## 2014-08-13 LAB — TYPE AND SCREEN
ABO/RH(D): O POS
Antibody Screen: NEGATIVE
Unit division: 0
Unit division: 0

## 2014-08-13 LAB — COMPREHENSIVE METABOLIC PANEL WITH GFR
ALT: 17 U/L (ref 0–35)
AST: 24 U/L (ref 0–37)
Albumin: 2.3 g/dL — ABNORMAL LOW (ref 3.5–5.2)
Alkaline Phosphatase: 56 U/L (ref 39–117)
Anion gap: 7 (ref 5–15)
BUN: 7 mg/dL (ref 6–23)
CO2: 29 mmol/L (ref 19–32)
Calcium: 7.7 mg/dL — ABNORMAL LOW (ref 8.4–10.5)
Chloride: 101 meq/L (ref 96–112)
Creatinine, Ser: 0.81 mg/dL (ref 0.50–1.10)
GFR calc Af Amer: 81 mL/min — ABNORMAL LOW
GFR calc non Af Amer: 70 mL/min — ABNORMAL LOW
Glucose, Bld: 109 mg/dL — ABNORMAL HIGH (ref 70–99)
Potassium: 2.9 mmol/L — ABNORMAL LOW (ref 3.5–5.1)
Sodium: 137 mmol/L (ref 135–145)
Total Bilirubin: 1.1 mg/dL (ref 0.3–1.2)
Total Protein: 5 g/dL — ABNORMAL LOW (ref 6.0–8.3)

## 2014-08-13 LAB — URINE CULTURE
Colony Count: NO GROWTH
Culture: NO GROWTH

## 2014-08-13 LAB — CBC
HEMATOCRIT: 30.9 % — AB (ref 36.0–46.0)
HEMOGLOBIN: 10.4 g/dL — AB (ref 12.0–15.0)
MCH: 29 pg (ref 26.0–34.0)
MCHC: 33.7 g/dL (ref 30.0–36.0)
MCV: 86.1 fL (ref 78.0–100.0)
PLATELETS: 154 10*3/uL (ref 150–400)
RBC: 3.59 MIL/uL — AB (ref 3.87–5.11)
RDW: 16.3 % — AB (ref 11.5–15.5)
WBC: 9.1 10*3/uL (ref 4.0–10.5)

## 2014-08-13 MED ORDER — ENOXAPARIN SODIUM 40 MG/0.4ML ~~LOC~~ SOLN: 40.0000 mg | SUBCUTANEOUS | Status: AC

## 2014-08-13 MED ORDER — OXYCODONE-ACETAMINOPHEN 5-325 MG PO TABS: 1.0000 | ORAL_TABLET | Freq: Two times a day (BID) | ORAL | Status: DC | PRN

## 2014-08-13 MED ORDER — POTASSIUM CHLORIDE CRYS ER 20 MEQ PO TBCR
40.0000 meq | EXTENDED_RELEASE_TABLET | Freq: Four times a day (QID) | ORAL | Status: DC
  Administered 2014-08-13: 40 meq via ORAL
  Filled 2014-08-13: qty 2

## 2014-08-13 MED ORDER — LORAZEPAM 0.5 MG PO TABS
0.5000 mg | ORAL_TABLET | Freq: Once | ORAL | Status: AC
  Administered 2014-08-13: 0.5 mg via ORAL

## 2014-08-13 MED ORDER — VENLAFAXINE HCL ER 75 MG PO CP24: 75.0000 mg | ORAL_CAPSULE | Freq: Every day | ORAL | Status: AC

## 2014-08-13 MED ORDER — LORAZEPAM 0.5 MG PO TABS: 0.2500 mg | ORAL_TABLET | Freq: Every day | ORAL | Status: AC | PRN

## 2014-08-13 NOTE — Clinical Social Work Note (Signed)
Patient to dc today per MD order. Patient to dc today to [facility]. RN to call report prior to transportation to: [phone] Transportation: [facility]- scheduled  CSW discussed dc plans with patient and daughter who are both agreeable.  Packet complete and on chart.  DC summary sent to SNF for review.    Vickii PennaGina Tredarius Cobern, LCSWA [phone]  Psychiatric & Orthopedics (5N 1-16) Clinical Social Worker

## 2014-08-13 NOTE — Discharge Summary (Signed)
Physician Discharge Summary  Paula Schultz:[id number] DOB: 18-Dec-1940 DOA: 08/09/2014  PCP: Blane Ohara, MD  Admit date: 08/09/2014 Discharge date: 08/13/2014  Time spent: 40 minutes  Recommendations for Outpatient Follow-up:  1. Follow-up with primary care physician within one week. 2. Follow-up with Dr. Lajoyce Corners in 2 weeks. 3. DVT prophylaxis with Lovenox for 3 more weeks.  Discharge Diagnoses:  Principal Problem:   Closed right hip fracture Active Problems:   Essential hypertension   Osteoporosis   Compression fracture   Hypothyroidism   GERD (gastroesophageal reflux disease)   UTI (lower urinary tract infection)   Acute blood loss anemia   Discharge Condition: Stable  Diet recommendation: Heart healthy  Filed Weights   08/09/14 2037 08/13/14 0228  Weight: 64.864 kg (143 lb) 64.864 kg (143 lb)    History of present illness:  Paula Schultz is a 74 y.o. female  18:45 today pt reports losing balance while ambulating in her home today. Denies hitting head. Fell directly onto the wood floor on R hip. Immediately painful. Unable to get up due to pain and R leg not funcitoning. Pain improved w/ IV p[ain medications in ED. Pain is non-radiating and constant. Denies syncope, HA, palpitations  Hospital Course:    Closed right hip fracture -Presented with right hip pain after fall, x-rays showed right hip fracture. -Intertrochanteric fracture, internally fixed with subtrochanteric nail. -Continue PT/OT, pain control with narcotics. -DVT prophylaxis with Lovenox. We'll continue rehabilitation at a SNF.  Acute blood loss anemia -Hemoglobin dropped to 7.6, patient presented with hemoglobin of 13.7. -Check the right hip, slight nonsignificant bruising, no evidence of hepatoma. -Status post transfusion of 2 units of packed RBCs, hemoglobin went up to 10.4.  UTI -Has leukocytosis and fever, started on levofloxacin. -Urinalysis consistent with UTI, culture did not grow  anything, antibiotics discontinued at time of discharge.  Hypokalemia -Repleted with oral and parenteral supplements. -Hypokalemia prior to discharge likely secondary to Lasix.   Leukocytosis/fever - secondary to fall and fracture - no clear infectious etiology, no sings of PNA on CXR - Patient developed fever of 100.1, was on levofloxacin for 3 days.   Essential hypertension - reasonable inpatient control  - continue metoprolol and trandolapril    Compression fractures in thoracic and lumbar spine -Has age-indeterminate T11 compression fracture with approximately 50% height loss. -Chronic T12 and L1 vertebral body compression fractures -Was able to ambulate with PT, recommended skilled nursing facility, continue PT.   HLD (hyperlipidemia) - continue ezetimibe    Hypothyroidism - continue synthroid    GERD (gastroesophageal reflux disease) - continue Protonix   Anxiety/depression -Patient supposed to be on Effexor ER 75 mg, was not reported at the time of admission. -Patient had episode of anxiety treated successfully with Ativan. -I personally contacted [facility] in [place], obtained the Effexor dose and restart at the time of discharge.   Procedures:  Open reduction internal fixation with trochanteric femoral nail with interlocking helical blade. Done by Dr. Lajoyce Corners.  Consultations:  Orthopedics  Discharge Exam: Filed Vitals:   08/13/14 0457  BP: 141/70  Pulse: 60  Temp: 97.9 F (36.6 C)  Resp: 16   General: Alert and awake, oriented x3, not in any acute distress. HEENT: anicteric sclera, pupils reactive to light and accommodation, EOMI CVS: S1-S2 clear, no murmur rubs or gallops Chest: clear to auscultation bilaterally, no wheezing, rales or rhonchi Abdomen: soft nontender, nondistended, normal bowel sounds, no organomegaly Extremities: no cyanosis, clubbing or edema noted bilaterally Neuro: Cranial nerves  II-XII intact, no focal neurological  deficits  Discharge Instructions   Discharge Instructions    Diet - low sodium heart healthy    Complete by:  As directed      Increase activity slowly    Complete by:  As directed      Weight bearing as tolerated    Complete by:  As directed           Current Discharge Medication List    START taking these medications   Details  acetaminophen (TYLENOL) 500 MG tablet Take 1 tablet (500 mg total) by mouth every 6 (six) hours as needed for mild pain. Qty: 30 tablet, Refills: 0    aspirin EC 325 MG tablet Take 1 tablet (325 mg total) by mouth daily. Qty: 30 tablet, Refills: 0    enoxaparin (LOVENOX) 40 MG/0.4ML injection Inject 0.4 mLs (40 mg total) into the skin daily. Qty: 21 Syringe, Refills: 0    venlafaxine XR (EFFEXOR-XR) 75 MG 24 hr capsule Take 1 capsule (75 mg total) by mouth daily with breakfast.      CONTINUE these medications which have CHANGED   Details  LORazepam (ATIVAN) 0.5 MG tablet Take 0.5-1 tablets (0.25-0.5 mg total) by mouth daily as needed for anxiety. Qty: 10 tablet, Refills: 0    oxyCODONE-acetaminophen (PERCOCET/ROXICET) 5-325 MG per tablet Take 1 tablet by mouth 2 (two) times daily as needed for moderate pain or severe pain (back / neck pain). Qty: 10 tablet, Refills: 0      CONTINUE these medications which have NOT CHANGED   Details  bisacodyl (DULCOLAX) 5 MG EC tablet Take 5 mg by mouth daily as needed for moderate constipation.    celecoxib (CELEBREX) 200 MG capsule Take 200 mg by mouth daily. Refills: 2    ferrous sulfate 325 (65 FE) MG tablet Take 325 mg by mouth daily with breakfast.    levothyroxine (SYNTHROID, LEVOTHROID) 100 MCG tablet Take 100 mcg by mouth daily. Refills: 5    loratadine (CLARITIN) 10 MG tablet Take 10 mg by mouth daily.    metoprolol (LOPRESSOR) 50 MG tablet Take 50 mg by mouth daily. Refills: 0    moexipril (UNIVASC) 15 MG tablet Take 30 mg by mouth 2 (two) times daily. Refills: 3    NEXIUM 40 MG capsule  Take 40 mg by mouth 2 (two) times daily. Refills: 5    risedronate (ACTONEL) 35 MG tablet Take 35 mg by mouth once a week. Refills: 5    WELCHOL 625 MG tablet Take 1,875 mg by mouth 2 (two) times daily. Refills: 3    ZETIA 10 MG tablet Take 10 mg by mouth daily. Refills: 0       Allergies  Allergen Reactions  . Penicillins    Follow-up Information    Follow up with DUDA,MARCUS V, MD In 2 weeks.   Specialty:  Orthopedic Surgery   Contact information:   [address] [place] [address] [phone]        The results of significant diagnostics from this hospitalization (including imaging, microbiology, ancillary and laboratory) are listed below for reference.    Significant Diagnostic Studies: Dg Chest 1 View  08/09/2014   CLINICAL DATA:  Preop for right proximal femur fracture ORIF. History of hypertension and mitral valve prolapse.  EXAM: CHEST - 1 VIEW  COMPARISON:  None.  FINDINGS: There is an opacity that projects over the cardiac silhouette bulging to the right of the right heart border. This is consistent  with a large hiatal hernia.  Cardiac silhouette is normal size.  No hilar masses.  Mild linear/reticular opacity in the medial right lung base consistent with atelectasis and/ scarring. Lungs otherwise clear. No pleural effusion or pneumothorax.  Bony thorax is diffusely demineralized.  IMPRESSION: No acute cardiopulmonary disease.   Electronically Signed   By: Amie Portland M.D.   On: 08/09/2014 23:26   Dg Lumbar Spine 2-3 Views  08/09/2014   CLINICAL DATA:  Right hip pain, low back pain  EXAM: LUMBAR SPINE - 2-3 VIEW  COMPARISON:  02/07/2009  FINDINGS: There are 5 nonrib bearing lumbar-type vertebral bodies. There is an S-shaped scoliosis of the thoracolumbar spine.  There is a T11 vertebral body compression fracture with approximately 50% height loss. There is a T12 vertebral body compression fracture with approximately 40% height loss. There is a mild L1  vertebral body compression fracture with approximately 10% height loss.  There is no spondylolysis.  There is 6 mm of retrolisthesis of L2 on L3.  There is degenerative disc disease at L5-S1. There is degenerative disc disease at T11-12, T12-L1 and L1-2.  The SI joints are unremarkable.  IMPRESSION: 1. Age-indeterminate T11 vertebral body compression fracture with approximately 50% height loss. 2. Chronic T12 and L1 vertebral body compression fractures.   Electronically Signed   By: Elige Ko   On: 08/09/2014 22:05   Dg Pelvis 1-2 Views  08/09/2014   CLINICAL DATA:  Pt fell today pain rt hip and lower back  EXAM: PELVIS - 1-2 VIEW  COMPARISON:  02/07/2009  FINDINGS: There is a comminuted displaced and angulated fracture of the proximal right femur. Fractures intertrochanteric with separate fracture fragments of the lesser and greater trochanters. There is prominent varus angulation. Fracture fragments are displaced is margins 2 cm.  No other fractures. No dislocation. Bones are demineralized. There is soft tissue swelling surrounding the fracture proximal right femur.  IMPRESSION: 1. Comminuted, displaced and angulated intertrochanteric fracture of the proximal right femur. No dislocation.   Electronically Signed   By: Amie Portland M.D.   On: 08/09/2014 21:58   Dg Knee 2 Views Right  08/09/2014   CLINICAL DATA:  Fall today with right leg pain.  Initial encounter.  EXAM: RIGHT KNEE - 1-2 VIEW  COMPARISON:  None.  FINDINGS: There is no evidence of fracture, dislocation, or joint effusion at the knee.  Knee osteoarthritis with mild marginal spurring. Joint narrowing is also mild.  IMPRESSION: No acute findings at the knee.   Electronically Signed   By: Tiburcio Pea M.D.   On: 08/09/2014 22:00   Dg Hip Operative Unilat With Pelvis Right  08/10/2014   CLINICAL DATA:  ORIF of right hip fracture.  EXAM: OPERATIVE RIGHT HIP  COMPARISON:  08/09/2014  FINDINGS: Single intraoperative image shows placement of a  fixation nail cross the femoral neck and proximal intramedullary rod in the right femur. Alignment appears significantly improved since the preoperative x-rays. Some mildly displaced lesser trochanteric fragments remain.  IMPRESSION: Significant improved alignment after ORIF of the right hip.   Electronically Signed   By: Irish Lack M.D.   On: 08/10/2014 19:46   Dg Femur, Min 2 Views Right  08/09/2014   CLINICAL DATA:  Status post fall, right hip pain  EXAM: DG FEMUR 2+V*R*  COMPARISON:  None.  FINDINGS: There is a comminuted, angulated and displaced right intertrochanteric fracture. There is no hip dislocation. There is mild tricompartmental osteoarthritis of the right knee. There is no  soft tissue abnormality.  IMPRESSION: Comminuted right intertrochanteric fracture without dislocation.   Electronically Signed   By: Elige Ko   On: 08/09/2014 21:58    Microbiology: Recent Results (from the past 240 hour(s))  Surgical pcr screen     Status: Abnormal   Collection Time: 08/10/14  3:34 PM  Result Value Ref Range Status   MRSA, PCR NEGATIVE NEGATIVE Final   Staphylococcus aureus POSITIVE (A) NEGATIVE Final    Comment:        The Xpert SA Assay (FDA approved for NASAL specimens in patients over 32 years of age), is one component of a comprehensive surveillance program.  Test performance has been validated by [facility] for patients greater than or equal to 34 year old. It is not intended to diagnose infection nor to guide or monitor treatment.   Urine culture     Status: None   Collection Time: 08/11/14 12:57 PM  Result Value Ref Range Status   Specimen Description URINE, CATHETERIZED  Final   Special Requests NONE  Final   Colony Count NO GROWTH Performed at [facility]   Final   Culture NO GROWTH Performed at [facility]   Final   Report Status 08/13/2014 FINAL  Final     Labs: Basic Metabolic Panel:  Recent Labs Lab 08/09/14 2201 08/10/14 0529  08/11/14 0535 08/12/14 0610 08/13/14 0445  NA 138 139 140 140 137  K 3.4* 3.4* 3.7 3.7 2.9*  CL 105 103 108 106 101  CO2 23 25 26 29 29   GLUCOSE 123* 174* 135* 111* 109*  BUN 18 15 17 13 7   CREATININE 0.96 0.75 1.06 0.83 0.81  CALCIUM 9.2 9.3 8.1* 8.1* 7.7*   Liver Function Tests:  Recent Labs Lab 08/10/14 0529 08/13/14 0445  AST 24 24  ALT 17 17  ALKPHOS 82 56  BILITOT 0.6 1.1  PROT 6.8 5.0*  ALBUMIN 3.4* 2.3*   No results for input(s): LIPASE, AMYLASE in the last 168 hours. No results for input(s): AMMONIA in the last 168 hours. CBC:  Recent Labs Lab 08/09/14 2201 08/10/14 0529 08/11/14 0535 08/12/14 0610 08/12/14 0757 08/13/14 0445  WBC 17.7* 11.9* 11.1* 9.5 10.2 9.1  NEUTROABS 15.9*  --   --   --   --   --   HGB 13.1 13.7 9.2* 7.9* 7.6* 10.4*  HCT 39.0 41.0 27.7* 24.3* 23.5* 30.9*  MCV 91.5 91.3 91.1 92.7 92.2 86.1  PLT 260 271 236 183 167 154   Cardiac Enzymes: No results for input(s): CKTOTAL, CKMB, CKMBINDEX, TROPONINI in the last 168 hours. BNP: BNP (last 3 results) No results for input(s): PROBNP in the last 8760 hours. CBG: No results for input(s): GLUCAP in the last 168 hours.     Signed:  Arielis Leonhart A  [facility] 08/13/2014, 10:02 AM

## 2014-08-13 NOTE — Progress Notes (Signed)
Occupational Therapy Treatment Patient Details Name: Paula CouncilJoanne C Lucia MRN: [id number] DOB: 09/06/1940 Today's Date: 08/13/2014    History of present illness Pt adm after fall with rt hip fx. Underwent rt hip ORIF on 08/10/13. PMH- HTN, osteporosis, compression fx of back   OT comments  Pt. Continues to remain motivated but progressing slow.  Requires mod a for amb. This am with max cues for hand placement on walker and notable difficulty progressing steps.  Max a for bed mobility.  Agree with need for short term snf placement for continued therapies to ensure safe return home.    Follow Up Recommendations  SNF;Supervision/Assistance - 24 hour    Equipment Recommendations  3 in 1 bedside comode          Precautions / Restrictions Restrictions RLE Weight Bearing: Weight bearing as tolerated       Mobility Bed Mobility Overal bed mobility: Needs Assistance Bed Mobility: Supine to Sit     Supine to sit: Mod assist     General bed mobility comments: hob flat, exit to the right required max a to bring b les out of bed and provide trunk support while scooting to eob  Transfers Overall transfer level: Needs assistance Equipment used: Rolling walker (2 wheeled) Transfers: Sit to/from [employer];Stand Pivot Transfers Sit to Stand: Mod assist Stand pivot transfers: Mod assist       General transfer comment: Verbal cues for safe hand placement and support needed to help trunk power up to standing and help control descent to sit in lower chair.     Balance     Sitting balance-Leahy Scale: Fair       Standing balance-Leahy Scale: Poor                     ADL Overall ADL's : Needs assistance/impaired                         Toilet Transfer: Moderate assistance;Ambulation;RW;BSC;Comfort height toilet Toilet Transfer Details (indicate cue type and reason): pt. required mod a to amb. to b.room with bsc over commode, max cues for hand placement for safe transfer   Toileting- Clothing Manipulation and Hygiene: Sit to/from stand;Moderate assistance       Functional mobility during ADLs: Minimal assistance;Moderate assistance;Rolling walker General ADL Comments: Pt moving slowly and would benefit from youth RW due to stature to promote improved functional mobility.                                       Cognition   Behavior During Therapy: WFL for tasks assessed/performed Overall Cognitive Status: Within Functional Limits for tasks assessed                                                  General Comments  pt. Has 2 dogs and several cats that is a big part of her life    Pertinent Vitals/ Pain       Pain Assessment:  (did not rate but would grimace with sit/stand)  Home Living  Prior Functioning/Environment              Frequency Min 2X/week     Progress Toward Goals  OT Goals(current goals can now be found in the care plan section)  Progress towards OT goals: Progressing toward goals     Plan Discharge plan remains appropriate    Co-evaluation                 End of Session Equipment Utilized During Treatment: Gait belt;Rolling walker   Activity Tolerance Patient tolerated treatment well   Patient Left in chair;with call bell/phone within reach   Nurse Communication Other (comment) (pt. has a clear jelly-like liquid coming from her buttocks)        Time: 2130-8657 OT Time Calculation (min): 25 min  Charges: OT General Charges $OT Visit: 1 Procedure OT Treatments $Self Care/Home Management : 23-37 mins  Robet Leu, COTA/L 08/13/2014, 6:53 AM

## 2014-08-13 NOTE — Progress Notes (Signed)
Patient ID: Paula CouncilJoanne C Burgert, female   DOB: 01/10/1941, 74 y.o.   MRN: [id number] Patient sitting up in a chair this morning comfortable. Anticipate discharge to skilled nursing. Bed offer is available.

## 2014-08-13 NOTE — Progress Notes (Signed)
[facility].

## 2014-08-13 NOTE — Progress Notes (Signed)
RN called into room by PT. Pt getting up to BR & passing gas during ambulation. With this gas, loose, clear, jelly consistency stools are coming out of pts bowels. NP Tom callahan through [facility] text paged & notified RN to leave note for oncoming MD. Pt in no obvious distress & resting comfortably in recliner. Nursing will continue to monitor.

## 2014-08-26 ENCOUNTER — Encounter (HOSPITAL_COMMUNITY): Payer: Self-pay | Admitting: Orthopedic Surgery

## 2014-09-02 ENCOUNTER — Other Ambulatory Visit (HOSPITAL_COMMUNITY): Payer: Self-pay | Admitting: Orthopedic Surgery

## 2014-09-02 ENCOUNTER — Encounter (HOSPITAL_COMMUNITY): Payer: Self-pay | Admitting: *Deleted

## 2014-09-02 MED ORDER — CLINDAMYCIN PHOSPHATE 900 MG/50ML IV SOLN
900.0000 mg | INTRAVENOUS | Status: AC
  Administered 2014-09-03: 900 mg via INTRAVENOUS
  Filled 2014-09-02: qty 50

## 2014-09-02 NOTE — Progress Notes (Signed)
Anesthesia Chart Review:  SAME DAY WORK-UP.  Patient is a 74 year old female scheduled for removal of trochanteric nail right hip, place dynamic hip screw tomorrow by Dr. Lajoyce Cornersuda. She is s/p IM nail intertrochanteric, right on 08/10/14.    Other history includes former smoker, HTN, MVP, GERD, anemia (ABL anemia s/p 2 Units PRBC 07/2013), arthritis, osteoporosis, depression, hypercholesterolemia.   08/10/14 EKG (post-op): ST at 142 bpm, non-specific ST abnormality. Tachycardia responsive to IVF and Tylenol (T 99.5). 08/09/14 EKG showed NSR. Tachycardia had resolved by discharge.  08/09/14 1V CXR: No acute cardiopulmonary disease.  She will get labs on arrival. Further evaluation by her assigned anesthesiologist on the day of surgery.    Velna Ochsllison Zelenak, PA-C [facility]Anesthesiology Phone [phone] 09/02/2014 12:44 PM

## 2014-09-02 NOTE — Progress Notes (Signed)
Pt SDW pre-op call was completed by both pt and pt daughter Eber JonesCarolyn. Pt denies SOB, chest pain and being under the care of a cardiologist. Pt denies having a stress test and cardiac cath. Pt made aware to stop taking Aspirin, vitamins, and herbal medications. Do not take any NSAIDs ie: Ibuprofen, Advil, Naproxen or any medication containing Aspirin such as Celebrex.

## 2014-09-03 ENCOUNTER — Encounter (HOSPITAL_COMMUNITY): Payer: Self-pay | Admitting: Anesthesiology

## 2014-09-03 ENCOUNTER — Encounter (HOSPITAL_COMMUNITY)
Admission: RE | Disposition: A | Discharge status: Skilled Nursing Facility | Payer: Self-pay | Source: Ambulatory Visit | Attending: Orthopedic Surgery

## 2014-09-03 ENCOUNTER — Inpatient Hospital Stay (HOSPITAL_COMMUNITY): Payer: Commercial Managed Care - HMO | Admitting: Vascular Surgery

## 2014-09-03 ENCOUNTER — Inpatient Hospital Stay (HOSPITAL_COMMUNITY): Payer: Commercial Managed Care - HMO

## 2014-09-03 ENCOUNTER — Inpatient Hospital Stay (HOSPITAL_COMMUNITY)
Admission: RE | Admit: 2014-09-03 | Discharge: 2014-09-06 | DRG: 481 | Disposition: A | Discharge status: Skilled Nursing Facility | Payer: Commercial Managed Care - HMO | Source: Ambulatory Visit | Attending: Orthopedic Surgery | Admitting: Orthopedic Surgery

## 2014-09-03 DIAGNOSIS — I10 Essential (primary) hypertension: Secondary | ICD-10-CM | POA: Diagnosis present

## 2014-09-03 DIAGNOSIS — Y92009 Unspecified place in unspecified non-institutional (private) residence as the place of occurrence of the external cause: Secondary | ICD-10-CM | POA: Diagnosis not present

## 2014-09-03 DIAGNOSIS — M810 Age-related osteoporosis without current pathological fracture: Secondary | ICD-10-CM | POA: Diagnosis present

## 2014-09-03 DIAGNOSIS — W19XXXA Unspecified fall, initial encounter: Secondary | ICD-10-CM | POA: Diagnosis present

## 2014-09-03 DIAGNOSIS — D62 Acute posthemorrhagic anemia: Secondary | ICD-10-CM | POA: Diagnosis not present

## 2014-09-03 DIAGNOSIS — K219 Gastro-esophageal reflux disease without esophagitis: Secondary | ICD-10-CM | POA: Diagnosis present

## 2014-09-03 DIAGNOSIS — Z419 Encounter for procedure for purposes other than remedying health state, unspecified: Secondary | ICD-10-CM

## 2014-09-03 DIAGNOSIS — T84498A Other mechanical complication of other internal orthopedic devices, implants and grafts, initial encounter: Secondary | ICD-10-CM | POA: Diagnosis present

## 2014-09-03 DIAGNOSIS — E780 Pure hypercholesterolemia: Secondary | ICD-10-CM | POA: Diagnosis present

## 2014-09-03 DIAGNOSIS — I341 Nonrheumatic mitral (valve) prolapse: Secondary | ICD-10-CM | POA: Diagnosis present

## 2014-09-03 DIAGNOSIS — T84194A Other mechanical complication of internal fixation device of right femur, initial encounter: Principal | ICD-10-CM | POA: Diagnosis present

## 2014-09-03 DIAGNOSIS — S7223XA Displaced subtrochanteric fracture of unspecified femur, initial encounter for closed fracture: Secondary | ICD-10-CM | POA: Diagnosis present

## 2014-09-03 DIAGNOSIS — M1990 Unspecified osteoarthritis, unspecified site: Secondary | ICD-10-CM | POA: Diagnosis present

## 2014-09-03 DIAGNOSIS — F329 Major depressive disorder, single episode, unspecified: Secondary | ICD-10-CM | POA: Diagnosis present

## 2014-09-03 DIAGNOSIS — Z87891 Personal history of nicotine dependence: Secondary | ICD-10-CM

## 2014-09-03 DIAGNOSIS — E039 Hypothyroidism, unspecified: Secondary | ICD-10-CM | POA: Diagnosis present

## 2014-09-03 HISTORY — DX: Family history of other specified conditions: Z84.89

## 2014-09-03 HISTORY — DX: Anemia, unspecified: D64.9

## 2014-09-03 HISTORY — DX: Fracture of unspecified part of neck of right femur, initial encounter for closed fracture: S72.001A

## 2014-09-03 HISTORY — PX: HARDWARE REMOVAL: SHX979

## 2014-09-03 HISTORY — PX: COMPRESSION HIP SCREW: SHX1386

## 2014-09-03 LAB — COMPREHENSIVE METABOLIC PANEL
ALBUMIN: 2.9 g/dL — AB (ref 3.5–5.2)
ALK PHOS: 154 U/L — AB (ref 39–117)
ALT: 22 U/L (ref 0–35)
AST: 23 U/L (ref 0–37)
Anion gap: 7 (ref 5–15)
BUN: 6 mg/dL (ref 6–23)
CHLORIDE: 103 mmol/L (ref 96–112)
CO2: 29 mmol/L (ref 19–32)
Calcium: 8.8 mg/dL (ref 8.4–10.5)
Creatinine, Ser: 0.72 mg/dL (ref 0.50–1.10)
GFR calc Af Amer: >90 mL/min (ref >90)
GFR calc non Af Amer: 83 mL/min — ABNORMAL LOW (ref >90)
Glucose, Bld: 102 mg/dL — ABNORMAL HIGH (ref 70–99)
POTASSIUM: 4 mmol/L (ref 3.5–5.1)
Sodium: 139 mmol/L (ref 135–145)
TOTAL PROTEIN: 6.2 g/dL (ref 6.0–8.3)
Total Bilirubin: 0.5 mg/dL (ref 0.3–1.2)

## 2014-09-03 LAB — CBC
HCT: 35.8 % — ABNORMAL LOW (ref 36.0–46.0)
Hemoglobin: 11.4 g/dL — ABNORMAL LOW (ref 12.0–15.0)
MCH: 30 pg (ref 26.0–34.0)
MCHC: 31.8 g/dL (ref 30.0–36.0)
MCV: 94.2 fL (ref 78.0–100.0)
PLATELETS: 509 10*3/uL — AB (ref 150–400)
RBC: 3.8 MIL/uL — AB (ref 3.87–5.11)
RDW: 15.6 % — AB (ref 11.5–15.5)
WBC: 5 10*3/uL (ref 4.0–10.5)

## 2014-09-03 LAB — PROTIME-INR
INR: 1.03 (ref 0.00–1.49)
PROTHROMBIN TIME: 13.6 s (ref 11.6–15.2)

## 2014-09-03 LAB — APTT: APTT: 29 s (ref 24–37)

## 2014-09-03 SURGERY — REMOVAL, HARDWARE
Anesthesia: General | Laterality: Right

## 2014-09-03 MED ORDER — DOCUSATE SODIUM 100 MG PO CAPS
100.0000 mg | ORAL_CAPSULE | Freq: Two times a day (BID) | ORAL | Status: DC
Start: 2014-09-03 — End: 2014-09-06
  Administered 2014-09-03 – 2014-09-06 (×6): 100 mg via ORAL
  Filled 2014-09-03 (×9): qty 1

## 2014-09-03 MED ORDER — ROCURONIUM BROMIDE 100 MG/10ML IV SOLN
INTRAVENOUS | Status: DC | PRN
  Administered 2014-09-03: 40 mg via INTRAVENOUS

## 2014-09-03 MED ORDER — LIDOCAINE HCL (CARDIAC) 20 MG/ML IV SOLN
INTRAVENOUS | Status: AC
  Filled 2014-09-03: qty 5

## 2014-09-03 MED ORDER — HYDROMORPHONE HCL 1 MG/ML IJ SOLN
INTRAMUSCULAR | Status: AC
  Administered 2014-09-03: 0.25 mg via INTRAVENOUS
  Filled 2014-09-03: qty 1

## 2014-09-03 MED ORDER — HYDROCODONE-ACETAMINOPHEN 5-325 MG PO TABS
ORAL_TABLET | ORAL | Status: AC
  Administered 2014-09-03: 2 via ORAL
  Filled 2014-09-03: qty 2

## 2014-09-03 MED ORDER — VANCOMYCIN HCL 1000 MG IV SOLR
INTRAVENOUS | Status: AC
  Filled 2014-09-03: qty 1000

## 2014-09-03 MED ORDER — ACETAMINOPHEN 650 MG RE SUPP: 650.0000 mg | Freq: Four times a day (QID) | RECTAL | Status: DC | PRN

## 2014-09-03 MED ORDER — SODIUM CHLORIDE 0.9 % IV SOLN: INTRAVENOUS | Status: DC

## 2014-09-03 MED ORDER — PROPOFOL 10 MG/ML IV BOLUS
INTRAVENOUS | Status: DC | PRN
  Administered 2014-09-03: 110 mg via INTRAVENOUS

## 2014-09-03 MED ORDER — METHOCARBAMOL 500 MG PO TABS
ORAL_TABLET | ORAL | Status: AC
  Filled 2014-09-03: qty 1

## 2014-09-03 MED ORDER — HYDROMORPHONE HCL 1 MG/ML IJ SOLN
INTRAMUSCULAR | Status: AC
  Filled 2014-09-03: qty 1

## 2014-09-03 MED ORDER — ONDANSETRON HCL 4 MG PO TABS
4.0000 mg | ORAL_TABLET | Freq: Four times a day (QID) | ORAL | Status: DC | PRN
  Administered 2014-09-05: 4 mg via ORAL
  Filled 2014-09-03: qty 1

## 2014-09-03 MED ORDER — MEPERIDINE HCL 25 MG/ML IJ SOLN
6.2500 mg | INTRAMUSCULAR | Status: DC | PRN
Start: 2014-09-03 — End: 2014-09-03

## 2014-09-03 MED ORDER — BISACODYL 5 MG PO TBEC: 5.0000 mg | DELAYED_RELEASE_TABLET | Freq: Every day | ORAL | Status: DC | PRN

## 2014-09-03 MED ORDER — METOCLOPRAMIDE HCL 10 MG PO TABS: 5.0000 mg | ORAL_TABLET | Freq: Three times a day (TID) | ORAL | Status: DC | PRN

## 2014-09-03 MED ORDER — HYDROMORPHONE HCL 1 MG/ML IJ SOLN
0.2500 mg | INTRAMUSCULAR | Status: DC | PRN
  Administered 2014-09-03: 0.25 mg via INTRAVENOUS
  Administered 2014-09-03: 0.5 mg via INTRAVENOUS
  Administered 2014-09-03 (×3): 0.25 mg via INTRAVENOUS

## 2014-09-03 MED ORDER — METHOCARBAMOL 1000 MG/10ML IJ SOLN
500.0000 mg | Freq: Four times a day (QID) | INTRAMUSCULAR | Status: DC | PRN
  Filled 2014-09-03: qty 5

## 2014-09-03 MED ORDER — PHENOL 1.4 % MT LIQD: 1.0000 | OROMUCOSAL | Status: DC | PRN

## 2014-09-03 MED ORDER — CLINDAMYCIN PHOSPHATE 600 MG/50ML IV SOLN
600.0000 mg | Freq: Four times a day (QID) | INTRAVENOUS | Status: AC
  Administered 2014-09-03 – 2014-09-04 (×2): 600 mg via INTRAVENOUS
  Filled 2014-09-03 (×2): qty 50

## 2014-09-03 MED ORDER — FENTANYL CITRATE 0.05 MG/ML IJ SOLN
INTRAMUSCULAR | Status: DC | PRN
  Administered 2014-09-03: 100 ug via INTRAVENOUS
  Administered 2014-09-03 (×2): 50 ug via INTRAVENOUS

## 2014-09-03 MED ORDER — GLYCOPYRROLATE 0.2 MG/ML IJ SOLN
INTRAMUSCULAR | Status: DC | PRN
  Administered 2014-09-03: 0.4 mg via INTRAVENOUS

## 2014-09-03 MED ORDER — SODIUM CHLORIDE 0.9 % IV SOLN
10.0000 mg | INTRAVENOUS | Status: DC | PRN
  Administered 2014-09-03: 50 ug/min via INTRAVENOUS

## 2014-09-03 MED ORDER — PROMETHAZINE HCL 25 MG/ML IJ SOLN
INTRAMUSCULAR | Status: AC
  Administered 2014-09-03: 6.25 mg via INTRAVENOUS
  Filled 2014-09-03: qty 1

## 2014-09-03 MED ORDER — PROPOFOL 10 MG/ML IV BOLUS
INTRAVENOUS | Status: AC
  Filled 2014-09-03: qty 20

## 2014-09-03 MED ORDER — PROMETHAZINE HCL 25 MG/ML IJ SOLN
6.2500 mg | INTRAMUSCULAR | Status: DC | PRN
  Administered 2014-09-03: 6.25 mg via INTRAVENOUS

## 2014-09-03 MED ORDER — MENTHOL 3 MG MT LOZG: 1.0000 | LOZENGE | OROMUCOSAL | Status: DC | PRN

## 2014-09-03 MED ORDER — SENNOSIDES-DOCUSATE SODIUM 8.6-50 MG PO TABS
1.0000 | ORAL_TABLET | Freq: Every evening | ORAL | Status: DC | PRN
  Administered 2014-09-04: 1 via ORAL
  Filled 2014-09-03: qty 1

## 2014-09-03 MED ORDER — ONDANSETRON HCL 4 MG/2ML IJ SOLN: 4.0000 mg | Freq: Four times a day (QID) | INTRAMUSCULAR | Status: DC | PRN

## 2014-09-03 MED ORDER — GENTAMICIN SULFATE 40 MG/ML IJ SOLN
INTRAMUSCULAR | Status: AC
  Filled 2014-09-03: qty 4

## 2014-09-03 MED ORDER — ONDANSETRON HCL 4 MG/2ML IJ SOLN
INTRAMUSCULAR | Status: DC | PRN
  Administered 2014-09-03: 4 mg via INTRAVENOUS

## 2014-09-03 MED ORDER — METHOCARBAMOL 500 MG PO TABS
500.0000 mg | ORAL_TABLET | Freq: Four times a day (QID) | ORAL | Status: DC | PRN
  Administered 2014-09-03 – 2014-09-05 (×4): 500 mg via ORAL
  Filled 2014-09-03 (×3): qty 1

## 2014-09-03 MED ORDER — PHENYLEPHRINE HCL 10 MG/ML IJ SOLN
INTRAMUSCULAR | Status: DC | PRN
  Administered 2014-09-03: 120 ug via INTRAVENOUS

## 2014-09-03 MED ORDER — LACTATED RINGERS IV SOLN
INTRAVENOUS | Status: DC | PRN
  Administered 2014-09-03: 11:00:00 via INTRAVENOUS

## 2014-09-03 MED ORDER — FENTANYL CITRATE 0.05 MG/ML IJ SOLN
INTRAMUSCULAR | Status: AC
  Filled 2014-09-03: qty 5

## 2014-09-03 MED ORDER — HYDROCODONE-ACETAMINOPHEN 5-325 MG PO TABS
1.0000 | ORAL_TABLET | Freq: Four times a day (QID) | ORAL | Status: DC | PRN
Start: 2014-09-03 — End: 2014-09-04
  Administered 2014-09-03 – 2014-09-04 (×3): 2 via ORAL
  Filled 2014-09-03 (×2): qty 2

## 2014-09-03 MED ORDER — NEOSTIGMINE METHYLSULFATE 10 MG/10ML IV SOLN
INTRAVENOUS | Status: DC | PRN
  Administered 2014-09-03: 3 mg via INTRAVENOUS

## 2014-09-03 MED ORDER — VANCOMYCIN HCL 500 MG IV SOLR
INTRAVENOUS | Status: AC
  Filled 2014-09-03: qty 500

## 2014-09-03 MED ORDER — LIDOCAINE HCL (CARDIAC) 20 MG/ML IV SOLN
INTRAVENOUS | Status: DC | PRN
  Administered 2014-09-03: 60 mg via INTRAVENOUS

## 2014-09-03 MED ORDER — MAGNESIUM CITRATE PO SOLN: 1.0000 | Freq: Once | ORAL | Status: AC | PRN

## 2014-09-03 MED ORDER — GENTAMICIN SULFATE 40 MG/ML IJ SOLN
INTRAMUSCULAR | Status: AC
  Filled 2014-09-03: qty 2

## 2014-09-03 MED ORDER — ONDANSETRON HCL 4 MG/2ML IJ SOLN
INTRAMUSCULAR | Status: AC
  Filled 2014-09-03: qty 2

## 2014-09-03 MED ORDER — MORPHINE SULFATE 2 MG/ML IJ SOLN
0.5000 mg | INTRAMUSCULAR | Status: DC | PRN
  Administered 2014-09-03 – 2014-09-04 (×4): 0.5 mg via INTRAVENOUS
  Filled 2014-09-03 (×4): qty 1

## 2014-09-03 MED ORDER — METOCLOPRAMIDE HCL 5 MG/ML IJ SOLN: 5.0000 mg | Freq: Three times a day (TID) | INTRAMUSCULAR | Status: DC | PRN

## 2014-09-03 MED ORDER — ACETAMINOPHEN 325 MG PO TABS
650.0000 mg | ORAL_TABLET | Freq: Four times a day (QID) | ORAL | Status: DC | PRN
  Administered 2014-09-04: 650 mg via ORAL
  Filled 2014-09-03: qty 2

## 2014-09-03 MED ORDER — PHENYLEPHRINE 40 MCG/ML (10ML) SYRINGE FOR IV PUSH (FOR BLOOD PRESSURE SUPPORT)
PREFILLED_SYRINGE | INTRAVENOUS | Status: AC
  Filled 2014-09-03: qty 10

## 2014-09-03 MED ORDER — 0.9 % SODIUM CHLORIDE (POUR BTL) OPTIME
TOPICAL | Status: DC | PRN
  Administered 2014-09-03: 1000 mL

## 2014-09-03 MED ORDER — LACTATED RINGERS IV SOLN
Freq: Once | INTRAVENOUS | Status: AC
  Administered 2014-09-03: 11:00:00 via INTRAVENOUS

## 2014-09-03 SURGICAL SUPPLY — 61 items
BANDAGE ELASTIC 4 VELCRO ST LF (GAUZE/BANDAGES/DRESSINGS) IMPLANT
BANDAGE ELASTIC 6 VELCRO ST LF (GAUZE/BANDAGES/DRESSINGS) IMPLANT
BANDAGE ESMARK 6X9 LF (GAUZE/BANDAGES/DRESSINGS) IMPLANT
BIT DRILL JC END 3.2X130 (BIT) ×1 IMPLANT
BNDG CMPR 9X6 STRL LF SNTH (GAUZE/BANDAGES/DRESSINGS)
BNDG COHESIVE 4X5 TAN STRL (GAUZE/BANDAGES/DRESSINGS) ×3 IMPLANT
BNDG ESMARK 6X9 LF (GAUZE/BANDAGES/DRESSINGS)
BNDG GAUZE ELAST 4 BULKY (GAUZE/BANDAGES/DRESSINGS) ×1 IMPLANT
COVER PERINEAL POST (MISCELLANEOUS) ×2 IMPLANT
COVER SURGICAL LIGHT HANDLE (MISCELLANEOUS) ×2 IMPLANT
CUFF TOURNIQUET SINGLE 34IN LL (TOURNIQUET CUFF) IMPLANT
CUFF TOURNIQUET SINGLE 44IN (TOURNIQUET CUFF) IMPLANT
DRAPE C-ARM 42X72 X-RAY (DRAPES) IMPLANT
DRAPE INCISE IOBAN 66X45 STRL (DRAPES) IMPLANT
DRAPE ORTHO SPLIT 77X108 STRL (DRAPES)
DRAPE STERI IOBAN 125X83 (DRAPES) ×1 IMPLANT
DRAPE SURG ORHT 6 SPLT 77X108 (DRAPES) IMPLANT
DRSG ADAPTIC 3X8 NADH LF (GAUZE/BANDAGES/DRESSINGS) ×1 IMPLANT
DRSG EMULSION OIL 3X3 NADH (GAUZE/BANDAGES/DRESSINGS) ×1 IMPLANT
DRSG MEPILEX BORDER 4X12 (GAUZE/BANDAGES/DRESSINGS) ×1 IMPLANT
DRSG MEPILEX BORDER 4X4 (GAUZE/BANDAGES/DRESSINGS) ×1 IMPLANT
DRSG MEPILEX BORDER 4X8 (GAUZE/BANDAGES/DRESSINGS) ×1 IMPLANT
DRSG PAD ABDOMINAL 8X10 ST (GAUZE/BANDAGES/DRESSINGS) ×1 IMPLANT
DURAPREP 26ML APPLICATOR (WOUND CARE) ×2 IMPLANT
ELECT REM PT RETURN 9FT ADLT (ELECTROSURGICAL) ×2
ELECTRODE REM PT RTRN 9FT ADLT (ELECTROSURGICAL) ×1 IMPLANT
GAUZE SPONGE 4X4 12PLY STRL (GAUZE/BANDAGES/DRESSINGS) ×1 IMPLANT
GLOVE BIOGEL PI IND STRL 9 (GLOVE) ×1 IMPLANT
GLOVE BIOGEL PI INDICATOR 9 (GLOVE) ×1
GLOVE SURG ORTHO 9.0 STRL STRW (GLOVE) ×3 IMPLANT
GOWN STRL REUS W/ TWL XL LVL3 (GOWN DISPOSABLE) ×3 IMPLANT
GOWN STRL REUS W/TWL XL LVL3 (GOWN DISPOSABLE) ×6
GUIDEPIN DHS/DCS (PIN) ×1 IMPLANT
GUIDEWIRE 3.2X400 (WIRE) ×1 IMPLANT
KIT BASIN OR (CUSTOM PROCEDURE TRAY) ×2 IMPLANT
KIT ROOM TURNOVER OR (KITS) ×2 IMPLANT
LINER BOOT UNIVERSAL DISP (MISCELLANEOUS) ×2 IMPLANT
MANIFOLD NEPTUNE II (INSTRUMENTS) ×1 IMPLANT
NS IRRIG 1000ML POUR BTL (IV SOLUTION) ×2 IMPLANT
PACK GENERAL/GYN (CUSTOM PROCEDURE TRAY) ×1 IMPLANT
PACK ORTHO EXTREMITY (CUSTOM PROCEDURE TRAY) ×2 IMPLANT
PAD ARMBOARD 7.5X6 YLW CONV (MISCELLANEOUS) ×4 IMPLANT
PAD CAST 4YDX4 CTTN HI CHSV (CAST SUPPLIES) ×1 IMPLANT
PADDING CAST COTTON 4X4 STRL (CAST SUPPLIES)
PLATE DHS 135 DEG/6HOLE (Plate) ×1 IMPLANT
SCREW CORTEX ST 4.5X34 (Screw) ×4 IMPLANT
SCREW CORTEX ST 4.5X36 (Screw) ×1 IMPLANT
SCREW DHS LAG 75MM (Screw) ×1 IMPLANT
SPONGE LAP 18X18 X RAY DECT (DISPOSABLE) ×2 IMPLANT
STAPLER VISISTAT 35W (STAPLE) ×1 IMPLANT
STOCKINETTE IMPERVIOUS 9X36 MD (GAUZE/BANDAGES/DRESSINGS) IMPLANT
SUT ETHILON 2 0 PSLX (SUTURE) ×1 IMPLANT
SUT VIC AB 0 CT1 27 (SUTURE) ×2
SUT VIC AB 0 CT1 27XBRD ANBCTR (SUTURE) IMPLANT
SUT VIC AB 1 CTB1 27 (SUTURE) ×2 IMPLANT
SUT VIC AB 2-0 CT1 27 (SUTURE) ×2
SUT VIC AB 2-0 CT1 TAPERPNT 27 (SUTURE) IMPLANT
SUT VIC AB 2-0 CTB1 (SUTURE) ×1 IMPLANT
TOWEL OR 17X24 6PK STRL BLUE (TOWEL DISPOSABLE) ×2 IMPLANT
TOWEL OR 17X26 10 PK STRL BLUE (TOWEL DISPOSABLE) ×2 IMPLANT
WATER STERILE IRR 1000ML POUR (IV SOLUTION) ×3 IMPLANT

## 2014-09-03 NOTE — Anesthesia Preprocedure Evaluation (Signed)
Anesthesia Evaluation  Patient identified by MRN, date of birth, ID band Patient awake    Reviewed: Allergy & Precautions, NPO status , Patient's Chart, lab work & pertinent test results, reviewed documented beta blocker date and time   History of Anesthesia Complications (+) Family history of anesthesia reaction  Airway Mallampati: I  TM Distance: >3 FB Neck ROM: Full    Dental no notable dental hx. (+) Teeth Intact, Caps   Pulmonary former smoker,  breath sounds clear to auscultation  Pulmonary exam normal       Cardiovascular hypertension, Pt. on medications and Pt. on home beta blockers Rhythm:Regular Rate:Normal     Neuro/Psych PSYCHIATRIC DISORDERS Anxiety Depression    GI/Hepatic Neg liver ROS, GERD-  Medicated and Controlled,  Endo/Other  Hypothyroidism Hyperlipidemia  Renal/GU negative Renal ROS  negative genitourinary   Musculoskeletal  (+) Arthritis -, Osteoarthritis,  Osteoporosis S/P Right hip Fx- repair 08/09/2014 now with collapse of hardware   Abdominal Normal abdominal exam  (+)   Peds  Hematology  (+) anemia , Last took ASA 3 weeks ago [facility] mg qd [facility]   Anesthesia Other Findings   Reproductive/Obstetrics negative OB ROS                             Anesthesia Physical Anesthesia Plan  ASA: III  Anesthesia Plan: General   Post-op Pain Management:    Induction: Intravenous  Airway Management Planned: Oral ETT  Additional Equipment:   Intra-op Plan:   Post-operative Plan: Extubation in OR  Informed Consent: I have reviewed the patients History and Physical, chart, labs and discussed the procedure including the risks, benefits and alternatives for the proposed anesthesia with the patient or authorized representative who has indicated his/her understanding and acceptance.   Dental advisory given  Plan Discussed with: CRNA,  Anesthesiologist and Surgeon  Anesthesia Plan Comments:         Anesthesia Quick Evaluation

## 2014-09-03 NOTE — Progress Notes (Signed)
Plan for discharge to skilled nursing. Nonweightbearing on the right lower extremity.

## 2014-09-03 NOTE — Anesthesia Postprocedure Evaluation (Signed)
  Anesthesia Post-op Note  Patient: Paula Schultz  Procedure(s) Performed: Procedure(s): Removal Trochanteric Nail Right Hip (Right) Place Dynamic Hip Screw (Right)  Patient Location: PACU  Anesthesia Type: General   Level of Consciousness: awake, alert  and oriented  Airway and Oxygen Therapy: Patient Spontanous Breathing  Post-op Pain: mild  Post-op Assessment: Post-op Vital signs reviewed  Post-op Vital Signs: Reviewed  Last Vitals:  Filed Vitals:   09/03/14 1433  BP: 145/61  Pulse: 66  Temp:   Resp: 15    Complications: No apparent anesthesia complications

## 2014-09-03 NOTE — Anesthesia Procedure Notes (Signed)
Procedure Name: Intubation Date/Time: 09/03/2014 12:32 PM Performed by: Sharlene DoryWALKER, Kenzo Ozment E Pre-anesthesia Checklist: Patient identified, Emergency Drugs available, Suction available, Patient being monitored and Timeout performed Patient Re-evaluated:Patient Re-evaluated prior to inductionOxygen Delivery Method: Circle system utilized Preoxygenation: Pre-oxygenation with 100% oxygen Intubation Type: IV induction Ventilation: Mask ventilation without difficulty Laryngoscope Size: Mac and 3 Grade View: Grade I Tube type: Oral Tube size: 7.0 mm Number of attempts: 1 Airway Equipment and Method: Stylet Placement Confirmation: ETT inserted through vocal cords under direct vision,  positive ETCO2 and breath sounds checked- equal and bilateral Secured at: 21 cm Tube secured with: Tape Dental Injury: Teeth and Oropharynx as per pre-operative assessment

## 2014-09-03 NOTE — Op Note (Signed)
09/03/2014  5:28 PM  PATIENT:  Paula Schultz    PRE-OPERATIVE DIAGNOSIS:  Collapse of Internal Fixation Right Intertrochanteric Hip Fracture  POST-OPERATIVE DIAGNOSIS:  Same  PROCEDURE:  Removal Trochanteric Nail Right Hip, Place Dynamic Hip Screw  SURGEON:  Nadara MustardUDA,Joseangel Nettleton V, MD  PHYSICIAN ASSISTANT:None ANESTHESIA:   General  PREOPERATIVE INDICATIONS:  Paula Schultz is a  74 y.o. female with a diagnosis of Collapse of Internal Fixation Right Intertrochanteric Hip Fracture who failed conservative measures and elected for surgical management.    The risks benefits and alternatives were discussed with the patient preoperatively including but not limited to the risks of infection, bleeding, nerve injury, cardiopulmonary complications, the need for revision surgery, among others, and the patient was willing to proceed.  OPERATIVE IMPLANTS: Dynamic hip screw with 6 holes  OPERATIVE FINDINGS: Stable construct after revision  OPERATIVE PROCEDURE: Patient is a 74 year old woman who initially underwent intramedullary nail fixation for a intertrochanteric hip fracture. Patient was discharged to skilled care the family felt that she was discharged home from skilled care early she was found at home landing on the floor after a fall and a puddle of urine. Patient had complete destruction of the internal fixation presents at this time for revision. Risks and benefits were discussed including infection neurovascular injury need for additional surgery. Patient and family state they understand and wish to proceed at this time. Patient was brought to the operating room and underwent a general anesthetic. After adequate levels of anesthesia were obtained patient's right lower extremity was prepped using DuraPrep draped into a sterile field. A lateral incision was made and this was carried down through the tensor fascia lata which was split the vastus lateralis was elevated. The distal interlocking screw and  spiral blade were removed. The nail was then extracted. Using the guidewire this was inserted just inferior to center center of the femoral head from a lateral position this was overdrilled and the compression bolt was inserted. A lateral plate was then secured with 6 screws distally. C-arm fluoroscopy verified alignment and reduction. The wound was irrigated with normal saline. The tensor fascia lata was closed using #1 Vicryl. Subcutaneous is closed using 0 Vicryl. Skin was closed using staples. A Mepilex dressing was applied. Patient was extubated taken to the PACU in stable condition.

## 2014-09-03 NOTE — Transfer of Care (Signed)
Immediate Anesthesia Transfer of Care Note  Patient: Paula Schultz  Procedure(s) Performed: Procedure(s): Removal Trochanteric Nail Right Hip (Right) Place Dynamic Hip Screw (Right)  Patient Location: PACU  Anesthesia Type:General  Level of Consciousness: awake, alert  and oriented  Airway & Oxygen Therapy: Patient Spontanous Breathing and Patient connected to face mask oxygen  Post-op Assessment: Report given to RN and Post -op Vital signs reviewed and stable  Post vital signs: Reviewed and stable  Last Vitals:  Filed Vitals:   09/03/14 1032  BP: 146/63  Pulse: 74  Temp: 36.8 C  Resp: 16    Complications: No apparent anesthesia complications

## 2014-09-03 NOTE — H&P (Signed)
Paula Schultz is an 74 y.o. female.   Chief Complaint: failure of internal fixation right hip HPI: patient is a 74 year old woman who underwent internal fixation for right intertrochanteric hip fracture. Patient was discharged to skilled nursing. Patient's family states that she was discharged early from skilled nursing and was discharged to home where patient was found lying on the floor after acute fall and collapse of internal fixation secondary to her fall.  Past Medical History  Diagnosis Date  . Hypertension   . Osteoporosis   . Hypercholesteremia   . Mitral valve prolapse   . Hypothyroidism   . Depression   . GERD (gastroesophageal reflux disease)   . Arthritis     osteo  . Family history of adverse reaction to anesthesia     daughter PONV  . Hip fracture, right   . Anemia     Past Surgical History  Procedure Laterality Date  . Abdominal hysterectomy    . Hernia repair    . Tonsillectomy    . Intramedullary (im) nail intertrochanteric Right 08/10/2014    Procedure: INTRAMEDULLARY (IM) NAIL INTERTROCHANTRIC;  Surgeon: Nadara MustardMarcus Duda V, MD;  Location: [facility] OR;  Service: Orthopedics;  Laterality: Right;  . Breast surgery      cyst excision  . Colonoscopy w/ biopsies and polypectomy      Family History  Problem Relation Age of Onset  . AAA (abdominal aortic aneurysm) Mother   . Heart attack Father   . Alcohol abuse Father    Social History:  reports that she quit smoking about 41 years ago. She has never used smokeless tobacco. She reports that she does not drink alcohol or use illicit drugs.  Allergies:  Allergies  Allergen Reactions  . Penicillins     No prescriptions prior to admission    No results found for this or any previous visit (from the past 48 hour(s)). No results found.  Review of Systems  All other systems reviewed and are negative.   There were no vitals taken for this visit. Physical Exam  On examination patient has pain with attempted range  of motion of the right hip. Radiograph shows failure of internal fixation. Assessment/Plan Assessment: Failure internal fixation right hip.  Plan: We will plan for removal of internal fixation placement of revision fixation. Risks and benefits were discussed with the patient and family including infection neurovascular injury pain DVT mortality need for additional surgery. Patient and family state they understand and wish to proceed at this time.  DUDA,MARCUS V 09/03/2014, 6:16 AM

## 2014-09-04 LAB — CBC
HCT: 30.5 % — ABNORMAL LOW (ref 36.0–46.0)
HEMOGLOBIN: 9.7 g/dL — AB (ref 12.0–15.0)
MCH: 29.7 pg (ref 26.0–34.0)
MCHC: 31.8 g/dL (ref 30.0–36.0)
MCV: 93.3 fL (ref 78.0–100.0)
Platelets: 556 10*3/uL — ABNORMAL HIGH (ref 150–400)
RBC: 3.27 MIL/uL — ABNORMAL LOW (ref 3.87–5.11)
RDW: 15.7 % — ABNORMAL HIGH (ref 11.5–15.5)
WBC: 8.9 10*3/uL (ref 4.0–10.5)

## 2014-09-04 LAB — BASIC METABOLIC PANEL
Anion gap: 8 (ref 5–15)
BUN: 13 mg/dL (ref 6–23)
CALCIUM: 8.4 mg/dL (ref 8.4–10.5)
CO2: 28 mmol/L (ref 19–32)
Chloride: 100 mmol/L (ref 96–112)
Creatinine, Ser: 1.68 mg/dL — ABNORMAL HIGH (ref 0.50–1.10)
GFR calc Af Amer: 33 mL/min — ABNORMAL LOW (ref >90)
GFR calc non Af Amer: 29 mL/min — ABNORMAL LOW (ref >90)
Glucose, Bld: 135 mg/dL — ABNORMAL HIGH (ref 70–99)
Potassium: 4.7 mmol/L (ref 3.5–5.1)
SODIUM: 136 mmol/L (ref 135–145)

## 2014-09-04 MED ORDER — EZETIMIBE 10 MG PO TABS
10.0000 mg | ORAL_TABLET | Freq: Every day | ORAL | Status: DC
  Administered 2014-09-04 – 2014-09-06 (×3): 10 mg via ORAL
  Filled 2014-09-04 (×4): qty 1

## 2014-09-04 MED ORDER — LORAZEPAM 0.5 MG PO TABS
0.5000 mg | ORAL_TABLET | Freq: Every evening | ORAL | Status: DC | PRN
  Administered 2014-09-04 – 2014-09-05 (×2): 0.5 mg via ORAL
  Filled 2014-09-04 (×2): qty 1

## 2014-09-04 MED ORDER — COLESEVELAM HCL 625 MG PO TABS
1875.0000 mg | ORAL_TABLET | Freq: Two times a day (BID) | ORAL | Status: DC
  Administered 2014-09-04 – 2014-09-06 (×4): 1875 mg via ORAL
  Filled 2014-09-04 (×6): qty 3

## 2014-09-04 MED ORDER — HYDROCHLOROTHIAZIDE 25 MG PO TABS
25.0000 mg | ORAL_TABLET | Freq: Every day | ORAL | Status: DC
  Administered 2014-09-04 – 2014-09-06 (×3): 25 mg via ORAL
  Filled 2014-09-04 (×4): qty 1

## 2014-09-04 MED ORDER — PANTOPRAZOLE SODIUM 40 MG PO TBEC
80.0000 mg | DELAYED_RELEASE_TABLET | Freq: Two times a day (BID) | ORAL | Status: DC
  Administered 2014-09-04 – 2014-09-06 (×4): 80 mg via ORAL
  Filled 2014-09-04 (×4): qty 2

## 2014-09-04 MED ORDER — OXYCODONE-ACETAMINOPHEN 5-325 MG PO TABS
2.0000 | ORAL_TABLET | Freq: Four times a day (QID) | ORAL | Status: DC | PRN
  Filled 2014-09-04: qty 2

## 2014-09-04 MED ORDER — OXYCODONE-ACETAMINOPHEN 5-325 MG PO TABS
1.0000 | ORAL_TABLET | Freq: Four times a day (QID) | ORAL | Status: DC | PRN
  Administered 2014-09-04: 2 via ORAL
  Administered 2014-09-04: 1 via ORAL
  Administered 2014-09-05 (×3): 2 via ORAL
  Filled 2014-09-04 (×4): qty 2

## 2014-09-04 MED ORDER — LORATADINE 10 MG PO TABS
10.0000 mg | ORAL_TABLET | Freq: Every day | ORAL | Status: DC
  Administered 2014-09-05 – 2014-09-06 (×2): 10 mg via ORAL
  Filled 2014-09-04 (×3): qty 1

## 2014-09-04 MED ORDER — LEVOTHYROXINE SODIUM 100 MCG PO TABS
100.0000 ug | ORAL_TABLET | Freq: Every day | ORAL | Status: DC
  Administered 2014-09-04 – 2014-09-06 (×3): 100 ug via ORAL
  Filled 2014-09-04 (×4): qty 1

## 2014-09-04 MED ORDER — LORAZEPAM 0.5 MG PO TABS: 0.5000 mg | ORAL_TABLET | Freq: Every day | ORAL | Status: DC | PRN

## 2014-09-04 MED ORDER — VENLAFAXINE HCL ER 75 MG PO CP24
225.0000 mg | ORAL_CAPSULE | Freq: Every day | ORAL | Status: DC
  Administered 2014-09-04 – 2014-09-05 (×2): 225 mg via ORAL
  Filled 2014-09-04 (×3): qty 3

## 2014-09-04 MED ORDER — METOPROLOL TARTRATE 50 MG PO TABS
50.0000 mg | ORAL_TABLET | Freq: Every day | ORAL | Status: DC
  Administered 2014-09-04 – 2014-09-06 (×3): 50 mg via ORAL
  Filled 2014-09-04 (×4): qty 1

## 2014-09-04 NOTE — Progress Notes (Signed)
Noted pt has not voided since surgery. Bladder scan only 88ml. Dr. August Saucerean notified orders for NS 500cc bolus and AM labs ordered and implemented. Barbera Settersurner, Sarissa Dern B RN

## 2014-09-04 NOTE — Progress Notes (Signed)
OT Cancellation Note  Patient Details Name: Zara CouncilJoanne C Dauphinee MRN: [id number] DOB: 05/09/1941   Cancelled Treatment:    Reason Eval/Treat Not Completed: OT screened.  Pt's current D/C plan is SNF. No apparent immediate acute care OT needs, therefore will defer OT to SNF. If OT eval is needed please call Acute Rehab Dept. at [phone] or text page OT at [phone].    Earlie RavelingStraub, Laiana Fratus L OTR/L [phone] 09/04/2014, 11:29 AM

## 2014-09-04 NOTE — Progress Notes (Signed)
09/04/2014   Pt due to void btw 11:45-12:45. Pt stated she didn't feel like she needed to go. Attempted to try to urinate on the bedpan and no success. Encouraged fluids. Bladder scanned by NT and got 70ml, Bladder by nurse and got 0ml. Paged MD on call for Dr. Lajoyce Cornersuda to advise on whether or not to in and out cath patient. Dr. August Saucerean stated he didn't see a concern as her bladder was not that full. Pt in no apparent distress or discomfort. Will continue to monitor.

## 2014-09-04 NOTE — Plan of Care (Signed)
Problem: Consults Goal: Hip/Femur Fracture Patient Education See Patient Education Module for education specifics. Outcome: Completed/Met Date Met:  09/04/14 REmoval trochanteric Nail right hip

## 2014-09-04 NOTE — Progress Notes (Signed)
Subjective: Pt stable but cant sit in chair for long time due to back   Objective: Vital signs in last 24 hours: Temp:  [97.2 F (36.2 C)-98.7 F (37.1 C)] 98.1 F (36.7 C) (02/13 0115) Pulse Rate:  [62-108] 102 (02/13 0510) Resp:  [13-18] 18 (02/13 0800) BP: (101-155)/(36-66) 116/56 mmHg (02/13 0510) SpO2:  [98 %-100 %] 98 % (02/13 0800)  Intake/Output from previous day: 02/12 0701 - 02/13 0700 In: 800 [I.V.:800] Out: 300 [Blood:300] Intake/Output this shift:    Exam:  Sensation intact distally Intact pulses distally Dorsiflexion/Plantar flexion intact  Labs:  Recent Labs  09/03/14 1025 09/04/14 0512  HGB 11.4* 9.7*    Recent Labs  09/03/14 1025 09/04/14 0512  WBC 5.0 8.9  RBC 3.80* 3.27*  HCT 35.8* 30.5*  PLT 509* 556*    Recent Labs  09/03/14 1025 09/04/14 0512  NA 139 136  K 4.0 4.7  CL 103 100  CO2 29 28  BUN 6 13  CREATININE 0.72 1.68*  GLUCOSE 102* 135*  CALCIUM 8.8 8.4    Recent Labs  09/03/14 1025  INR 1.03    Assessment/Plan: Mobilize as tolerated nwb left leg - snf next week   DEAN,GREGORY SCOTT 09/04/2014, 12:37 PM

## 2014-09-04 NOTE — Evaluation (Signed)
Physical Therapy Evaluation Patient Details Name: Paula Schultz MRN: [id number] DOB: 28-Oct-1940 Today's Date: 09/04/2014   History of Present Illness  patient is a 74 year old woman who underwent internal fixation for right intertrochanteric hip fracture. Patient was discharged to skilled nursing. Patient's family states that she was discharged early from skilled nursing and was discharged to home where patient was found lying on the floor after acute fall and collapse of internal fixation secondary to her fall.  Clinical Impression  Pt with increased difficulty maintaining R LE NWB greatly limiting ability to amb. Pt remains unsafe to return home alone due to requiring maxA for all ADLs and transfers at this time. Acute PT to con't to follow to progress mobility as able.     Follow Up Recommendations SNF    Equipment Recommendations  None recommended by PT    Recommendations for Other Services       Precautions / Restrictions Precautions Precautions: Fall Restrictions Weight Bearing Restrictions: Yes RLE Weight Bearing: Non weight bearing      Mobility  Bed Mobility Overal bed mobility: Needs Assistance Bed Mobility: Supine to Sit     Supine to sit: Mod assist;+2 for physical assistance     General bed mobility comments: assist for LE managemet and trunk  Transfers Overall transfer level: Needs assistance Equipment used: Rolling walker (2 wheeled) Transfers: Sit to/from [employer] Sit to Stand: +2 physical assistance;Mod assist Stand pivot transfers: +2 physical assistance;Mod assist       General transfer comment: increased difficulty due to R LE NWB, assist to maintian R LE NWB and assist to manage RW and pivot on L LE. max directional sequencing v/c's.  Ambulation/Gait             General Gait Details: pt unable to amb this date due inability mantaining R LE NWB  Stairs            Wheelchair Mobility    Modified Rankin  (Stroke Patients Only)       Balance Overall balance assessment: Needs assistance Sitting-balance support: Feet supported;Bilateral upper extremity supported Sitting balance-Leahy Scale: Fair     Standing balance support: Bilateral upper extremity supported Standing balance-Leahy Scale: Poor Standing balance comment: must use RW due to R LE NWB                             Pertinent Vitals/Pain Pain Assessment: 0-10 Pain Score: 7  Pain Location:  (R leg) Pain Descriptors / Indicators: Aching    Home Living Family/patient expects to be discharged to:: Skilled nursing facility                 Additional Comments: pt returned home for 1 week from previous rehab stay    Prior Function Level of Independence: Independent with assistive device(s)   Gait / Transfers Assistance Needed: RW     Comments: fell in bathroom     Hand Dominance        Extremity/Trunk Assessment   Upper Extremity Assessment: Overall WFL for tasks assessed           Lower Extremity Assessment: RLE deficits/detail RLE Deficits / Details: able to initiate quad set and tolerate AAROM to R hip into flexion to 45 deg    Cervical / Trunk Assessment: Normal  Communication   Communication: No difficulties  Cognition Arousal/Alertness: Awake/alert Behavior During Therapy: WFL for tasks assessed/performed Overall Cognitive Status: Within Functional Limits for  tasks assessed                      General Comments      Exercises Total Joint Exercises Quad Sets: AROM;Both;10 reps;Supine Heel Slides: AAROM;Right;10 reps;Supine      Assessment/Plan    PT Assessment Patient needs continued PT services  PT Diagnosis Difficulty walking;Abnormality of gait;Acute pain   PT Problem List Decreased strength;Decreased activity tolerance;Decreased balance;Decreased mobility;Decreased knowledge of use of DME;Pain  PT Treatment Interventions DME instruction;Gait  training;Therapeutic activities;Functional mobility training;Therapeutic exercise;Balance training;Patient/family education   PT Goals (Current goals can be found in the Care Plan section) Acute Rehab PT Goals Patient Stated Goal: got to [facility] in [place] PT Goal Formulation: With patient Time For Goal Achievement: 09/18/14 Potential to Achieve Goals: Fair    Frequency Min 5X/week   Barriers to discharge Decreased caregiver support      Co-evaluation               End of Session Equipment Utilized During Treatment: Gait belt Activity Tolerance: Patient limited by pain Patient left: in chair;with call bell/phone within reach Nurse Communication: Mobility status         Time: [id number] PT Time Calculation (min) (ACUTE ONLY): 16 min   Charges:   PT Evaluation $Initial PT Evaluation Tier I: 1 Procedure     PT G CodesMarcene Brawn:        Laster Appling Marie 09/04/2014, 10:55 AM   Lewis ShockAshly Keiaira Donlan, PT, DPT Pager #: [phone] Office #: [phone]

## 2014-09-05 LAB — CBC
HCT: 23.6 % — ABNORMAL LOW (ref 36.0–46.0)
Hemoglobin: 7.6 g/dL — ABNORMAL LOW (ref 12.0–15.0)
MCH: 29.9 pg (ref 26.0–34.0)
MCHC: 32.2 g/dL (ref 30.0–36.0)
MCV: 92.9 fL (ref 78.0–100.0)
Platelets: 403 10*3/uL — ABNORMAL HIGH (ref 150–400)
RBC: 2.54 MIL/uL — AB (ref 3.87–5.11)
RDW: 15.9 % — ABNORMAL HIGH (ref 11.5–15.5)
WBC: 11.2 10*3/uL — ABNORMAL HIGH (ref 4.0–10.5)

## 2014-09-05 LAB — BASIC METABOLIC PANEL
Anion gap: 10 (ref 5–15)
BUN: 16 mg/dL (ref 6–23)
CO2: 22 mmol/L (ref 19–32)
Calcium: 8.4 mg/dL (ref 8.4–10.5)
Chloride: 102 mmol/L (ref 96–112)
Creatinine, Ser: 1.24 mg/dL — ABNORMAL HIGH (ref 0.50–1.10)
GFR calc Af Amer: 48 mL/min — ABNORMAL LOW (ref >90)
GFR, EST NON AFRICAN AMERICAN: 42 mL/min — AB (ref >90)
Glucose, Bld: 111 mg/dL — ABNORMAL HIGH (ref 70–99)
Potassium: 3.9 mmol/L (ref 3.5–5.1)
Sodium: 134 mmol/L — ABNORMAL LOW (ref 135–145)

## 2014-09-05 LAB — PREPARE RBC (CROSSMATCH)

## 2014-09-05 MED ORDER — SODIUM CHLORIDE 0.9 % IV SOLN: Freq: Once | INTRAVENOUS | Status: DC

## 2014-09-05 NOTE — Clinical Social Work Psychosocial (Signed)
Clinical Social Work Department BRIEF PSYCHOSOCIAL ASSESSMENT 09/05/2014  Patient:  Paula Schultz, Paula Schultz     Account Number:  [id number]     Admit date:  09/03/2014  Clinical Social Worker:  Hubert Azure  Date/Time:  09/05/2014 09:19 PM  Referred by:  Physician  Date Referred:  09/05/2014 Referred for  SNF Placement   Other Referral:   Interview type:  Patient Other interview type:    PSYCHOSOCIAL DATA Living Status:  WITH ADULT CHILDREN Admitted from facility:   Level of care:   Primary support name:  Patient listed son and daughter as support. Primary support relationship to patient:  CHILD, ADULT Degree of support available:   Good    CURRENT CONCERNS Current Concerns  Post-Acute Placement   Other Concerns:    SOCIAL WORK ASSESSMENT / PLAN CSW met with patient who was alert and oriented x4. CSW introduced self and explained role. CSW explained SNF placement process and discussed d/c plan with patient. Per patient, she currently lives with adult son and daughter. Patient states she has been to SNF before and is agreeable to return to get strong enough to return home.   Assessment/plan status:  Other - See comment Other assessment/ plan:   CSW to submit PASARR and complete FL2 for placement.   Information/referral to community resources:    PATIENT'S/FAMILY'S RESPONSE TO PLAN OF CARE: Patient agreeable to SNF placement and prefers [facility] ([place]). Patient states she does not want to be transported in the snow because she is "scared" of the snow and the possibilities of what may happen such as an accident while being transported to facility. CSW acknowledged patient's statements and provided appropriate emotional support.    [place], [place] Weekend Clinical Social Worker [phone]

## 2014-09-05 NOTE — Clinical Social Work Placement (Addendum)
Clinical Social Work Department CLINICAL SOCIAL WORK PLACEMENT NOTE 09/05/2014  Patient:  Paula CouncilFOSTER,Paula Schultz  Account Number:  [id number] Admit date:  09/03/2014  Clinical Social Worker:  Vivi Barrackrystal Patrick-jefferson, LCSWA  Date/time:  09/05/2014 09:18 PM  Clinical Social Work is seeking post-discharge placement for this patient at the following level of care:   SKILLED NURSING   (*CSW will update this form in Epic as items are completed)   09/05/2014  Patient/family provided with [facility] Department of Clinical Social Work's list of facilities offering this level of care within the geographic area requested by the patient (or if unable, by the patient's family).  09/05/2014  Patient/family informed of their freedom to choose among providers that offer the needed level of care, that participate in Medicare, Medicaid or managed care program needed by the patient, have an available bed and are willing to accept the patient.  09/05/2014  Patient/family informed of [facility]' ownership interest in [facility], as well as of the fact that they are under no obligation to receive care at this facility.  PASARR submitted to EDS on Existing PASARR number received on Existing  FL2 transmitted to all facilities in geographic area requested by pt/family on  09/05/2014 FL2 transmitted to all facilities within larger geographic area on   Patient informed that his/her managed care company has contracts with or will negotiate with  certain facilities, including the following:     Patient/family informed of bed offers received:  09/05/2014 [facility] Physician recommends and patient chooses bed at  n/a  Patient to be transferred to  [facility] [place] on  09/06/2014 Patient to be transferred to facility by [facility] Patient and family notified of transfer on 09/06/2014 Name of family member notified:  Eber Jonesarolyn  The following physician request were entered in  Epic:   Additional Comments:   Vivi Barrackrystal Patrick-Jefferson, LCSWA Weekend Clinical Social Worker [phone]

## 2014-09-05 NOTE — Progress Notes (Signed)
Subjective: 2 Days Post-Op Procedure(s) (LRB): Removal Trochanteric Nail Right Hip (Right) Place Dynamic Hip Screw (Right) Patient reports pain as mild and moderate.   Pain improving  Objective: Vital signs in last 24 hours: Temp:  [97.4 F (36.3 C)-97.8 F (36.6 C)] 97.8 F (36.6 C) (02/14 0530) Pulse Rate:  [101-116] 103 (02/14 0530) Resp:  [18] 18 (02/14 0530) BP: (110-155)/(43-49) 119/43 mmHg (02/14 0530) SpO2:  [95 %-100 %] 96 % (02/14 0530)  Intake/Output from previous day: 02/13 0701 - 02/14 0700 In: 120 [P.O.:120] Out: 3 [Urine:2; Stool:1] Intake/Output this shift:     Recent Labs  09/03/14 1025 09/04/14 0512 09/05/14 0500  HGB 11.4* 9.7* 7.6*    Recent Labs  09/04/14 0512 09/05/14 0500  WBC 8.9 11.2*  RBC 3.27* 2.54*  HCT 30.5* 23.6*  PLT 556* 403*    Recent Labs  09/04/14 0512 09/05/14 0500  NA 136 134*  K 4.7 3.9  CL 100 102  CO2 28 22  BUN 13 16  CREATININE 1.68* 1.24*  GLUCOSE 135* 111*  CALCIUM 8.4 8.4    Recent Labs  09/03/14 1025  INR 1.03    Incision: dressing C/D/I and no drainage  Assessment/Plan: 2 Days Post-Op Procedure(s) (LRB): Removal Trochanteric Nail Right Hip (Right) Place Dynamic Hip Screw (Right) Will give one unit PRBC today.  Pulse 103 BP 119/43   PETRARCA,BRIAN 09/05/2014, 8:35 AM

## 2014-09-06 ENCOUNTER — Encounter (HOSPITAL_COMMUNITY): Payer: Self-pay | Admitting: Orthopedic Surgery

## 2014-09-06 LAB — BASIC METABOLIC PANEL
Anion gap: 4 — ABNORMAL LOW (ref 5–15)
BUN: 10 mg/dL (ref 6–23)
CO2: 28 mmol/L (ref 19–32)
Calcium: 8.6 mg/dL (ref 8.4–10.5)
Chloride: 103 mmol/L (ref 96–112)
Creatinine, Ser: 0.88 mg/dL (ref 0.50–1.10)
GFR calc Af Amer: 73 mL/min — ABNORMAL LOW (ref >90)
GFR calc non Af Amer: 63 mL/min — ABNORMAL LOW (ref >90)
Glucose, Bld: 100 mg/dL — ABNORMAL HIGH (ref 70–99)
Potassium: 3.4 mmol/L — ABNORMAL LOW (ref 3.5–5.1)
Sodium: 135 mmol/L (ref 135–145)

## 2014-09-06 LAB — TYPE AND SCREEN
ABO/RH(D): O POS
ANTIBODY SCREEN: NEGATIVE
Unit division: 0

## 2014-09-06 LAB — CBC
HEMATOCRIT: 29.8 % — AB (ref 36.0–46.0)
Hemoglobin: 10 g/dL — ABNORMAL LOW (ref 12.0–15.0)
MCH: 29.2 pg (ref 26.0–34.0)
MCHC: 33.6 g/dL (ref 30.0–36.0)
MCV: 87.1 fL (ref 78.0–100.0)
Platelets: 356 10*3/uL (ref 150–400)
RBC: 3.42 MIL/uL — ABNORMAL LOW (ref 3.87–5.11)
RDW: 17.3 % — ABNORMAL HIGH (ref 11.5–15.5)
WBC: 12.8 10*3/uL — ABNORMAL HIGH (ref 4.0–10.5)

## 2014-09-06 MED ORDER — OXYCODONE-ACETAMINOPHEN 5-325 MG PO TABS: 1.0000 | ORAL_TABLET | Freq: Two times a day (BID) | ORAL | Status: AC | PRN

## 2014-09-06 NOTE — Discharge Planning (Addendum)
Patient will discharge today per MD order. Patient will discharge to [facility] [place] SN[facility] RN to call report prior to transportation to [phone] Transportation: [facility]  CSW sent discharge summary to SNF for review.  Packet is complete.  RN and patient aware of discharge plans.  CSW discussed discharge plans with daughter, Eber JonesCarolyn, who is aware and agreeable to dc plans.  Vickii PennaGina Isidro Monks, LCSWA [phone]  Psychiatric & Orthopedics (5N 1-16) Clinical Social Worker

## 2014-09-06 NOTE — Progress Notes (Signed)
Patient ID: Paula Schultz, female   DOB: 06/19/1941, 74 y.o.   MRN: [id number] Patient comfortable this morning. Paperwork completed for discharge to rehabilitation.

## 2014-09-06 NOTE — Discharge Summary (Signed)
Physician Discharge Summary  Patient ID: Paula Schultz MRN: [id number] DOB/AGE: 1941/07/16 74 y.o.  Admit date: 09/03/2014 Discharge date: 09/06/2014  Admission Diagnoses: Failure of internal fixation right hip fracture status post repeat fall  Discharge Diagnoses:  Active Problems:   Hip subtrochanteric fracture   Discharged Condition: stable  Hospital Course: Patient's hospital course was essentially unremarkable. She underwent revision internal fixation of the right hip subtrochanteric fracture. Postoperatively patient progressed slowly and was discharged to skilled nursing.  Consults: None  Significant Diagnostic Studies: labs: Routine labs  Treatments: surgery: See operative note  Discharge Exam: Blood pressure 144/59, pulse 103, temperature 98.2 F (36.8 C), temperature source Oral, resp. rate 16, height  (1.626 m), weight 64.864 kg (143 lb), SpO2 97 %. Incision/Wound: dressing clean dry and intact  Disposition: 03-Skilled Nursing Facility  Discharge Instructions    Call MD / Call 911    Complete by:  As directed   If you experience chest pain or shortness of breath, CALL 911 and be transported to the hospital emergency room.  If you develope a fever above 101 F, pus (white drainage) or increased drainage or redness at the wound, or calf pain, call your surgeon's office.     Constipation Prevention    Complete by:  As directed   Drink plenty of fluids.  Prune juice may be helpful.  You may use a stool softener, such as Colace (over the counter) 100 mg twice a day.  Use MiraLax (over the counter) for constipation as needed.     Diet - low sodium heart healthy    Complete by:  As directed      Increase activity slowly as tolerated    Complete by:  As directed      Non weight bearing    Complete by:  As directed   Laterality:  right  Extremity:  Lower            Medication List    TAKE these medications        acetaminophen 500 MG tablet  Commonly known  as:  TYLENOL  Take 1 tablet (500 mg total) by mouth every 6 (six) hours as needed for mild pain.     ascorbic acid 500 MG tablet  Commonly known as:  VITAMIN C  Take 500 mg by mouth daily.     aspirin EC 325 MG tablet  Take 1 tablet (325 mg total) by mouth daily.     bisacodyl 5 MG EC tablet  Commonly known as:  DULCOLAX  Take 5 mg by mouth daily as needed for moderate constipation.     celecoxib 200 MG capsule  Commonly known as:  CELEBREX  Take 200 mg by mouth daily.     enoxaparin 40 MG/0.4ML injection  Commonly known as:  LOVENOX  Inject 0.4 mLs (40 mg total) into the skin daily.     ferrous sulfate 325 (65 FE) MG tablet  Take 325 mg by mouth daily with breakfast.     hydrochlorothiazide 25 MG tablet  Commonly known as:  HYDRODIURIL  Take 25 mg by mouth daily.     levothyroxine 100 MCG tablet  Commonly known as:  SYNTHROID, LEVOTHROID  Take 100 mcg by mouth daily.     loratadine 10 MG tablet  Commonly known as:  CLARITIN  Take 10 mg by mouth daily.     LORazepam 0.5 MG tablet  Commonly known as:  ATIVAN  Take 0.5-1 tablets (0.25-0.5 mg total) by mouth  daily as needed for anxiety.     metoprolol 50 MG tablet  Commonly known as:  LOPRESSOR  Take 50 mg by mouth daily.     moexipril 15 MG tablet  Commonly known as:  UNIVASC  Take 30 mg by mouth 2 (two) times daily.     multivitamin with minerals tablet  Take 1 tablet by mouth daily.     NEXIUM 40 MG capsule  Generic drug:  esomeprazole  Take 40 mg by mouth 2 (two) times daily.     oxyCODONE-acetaminophen 5-325 MG per tablet  Commonly known as:  PERCOCET/ROXICET  Take 1 tablet by mouth 2 (two) times daily as needed for moderate pain or severe pain (back / neck pain).     risedronate 35 MG tablet  Commonly known as:  ACTONEL  Take 35 mg by mouth once a week. Wednesdays     venlafaxine XR 75 MG 24 hr capsule  Commonly known as:  EFFEXOR-XR  Take 1 capsule (75 mg total) by mouth daily with breakfast.      WELCHOL 625 MG tablet  Generic drug:  colesevelam  Take 1,875 mg by mouth 2 (two) times daily.     ZETIA 10 MG tablet  Generic drug:  ezetimibe  Take 10 mg by mouth daily.           Follow-up Information    Follow up with DUDA,MARCUS V, MD In 2 weeks.   Specialty:  Orthopedic Surgery   Contact information:   [address] [place] [address] [phone]       Signed: Nadara MustardDUDA,MARCUS V 09/06/2014, 6:27 AM

## 2014-09-06 NOTE — Progress Notes (Signed)
Patient asked RN for medication on 09/05/2014 during the 2200 hour. RN administered Ativan 0.5 mg, Zofran 4 mg, Percocet 2 tabs, and Robaxin 500 mg at 2216. Prior to administering pain medication patient was able to ask RN specifically for pain medication and also able to carry on an oriented conversation. When the RN brought the pain medication back into the patients room approximately 5 minutes later the patient began to say a jumble of words that made no logical sense. Throughout the night the RN and NT completed hourly rounds and the patient would fluctuate between being alert and oriented times 4 to saying a jumble of words the next time a RN or a NT was in the room. At shift change RN reported to oncoming nurse the occurrence. Patients night RN called the attending MD to inform him of the change in the patients mentation. RN ordered to discontinue all narcotic medications and to only administer Tylenol for pain control. MD informed RN that the patient has baseline dementia along with being mixed with narcotics could cause increased confusion and disorientation. MD did not recommend CT scan or any further evaluation at the moment. RN called day shift RN back to inform her of the doctors telephone orders. Nursing will continue to monitor.

## 2014-09-07 NOTE — Progress Notes (Signed)
CARE MANAGEMENT NOTE 09/07/2014  Patient:  Paula Schultz,Paula Schultz   Account Number:  [id number]  Date Initiated:  09/07/2014  Documentation initiated by:  [facility]  Subjective/Objective Assessment:   rt hip fracture, s/p rt hip screw     Action/Plan:   Pt/OT evals- recommended SNF   Anticipated DC Date:     Anticipated DC Plan:    In-house referral  Clinical Social Worker      DC [employer] Services  CM consult                 Status of service:  Completed, signed off Medicare Important Message given?  YES Date Medicare IM given:  09/06/2014 Medicare IM given by:  [facility]Lain[facility]  Discharge Disposition:  SKILLED NURSING FACILITY  Per UR Regulation:  Reviewed for med. necessity/level of care/duration of stay

## 2014-09-21 DEATH — deceased

## 2015-01-17 ENCOUNTER — Other Ambulatory Visit: Payer: Self-pay

## 2016-11-15 IMAGING — RF DG HIP (WITH PELVIS) OPERATIVE*R*
1 series · 2 of 2 positions shown · non-contrast
Comparison: Intraoperative fluoroscopic spot view of the right hip
08/10/2014.

CLINICAL DATA: Status post revision of fixation of a right
intertrochanteric fracture.

EXAM:
OPERATIVE RIGHT HIP (WITH PELVIS IF PERFORMED) SINGLE VIEW
TECHNIQUE: Fluoroscopic spot image(s) were submitted for interpretation
post-operatively.

[Series 1: run · 2 of 2 slices shown]
[im 1/2]
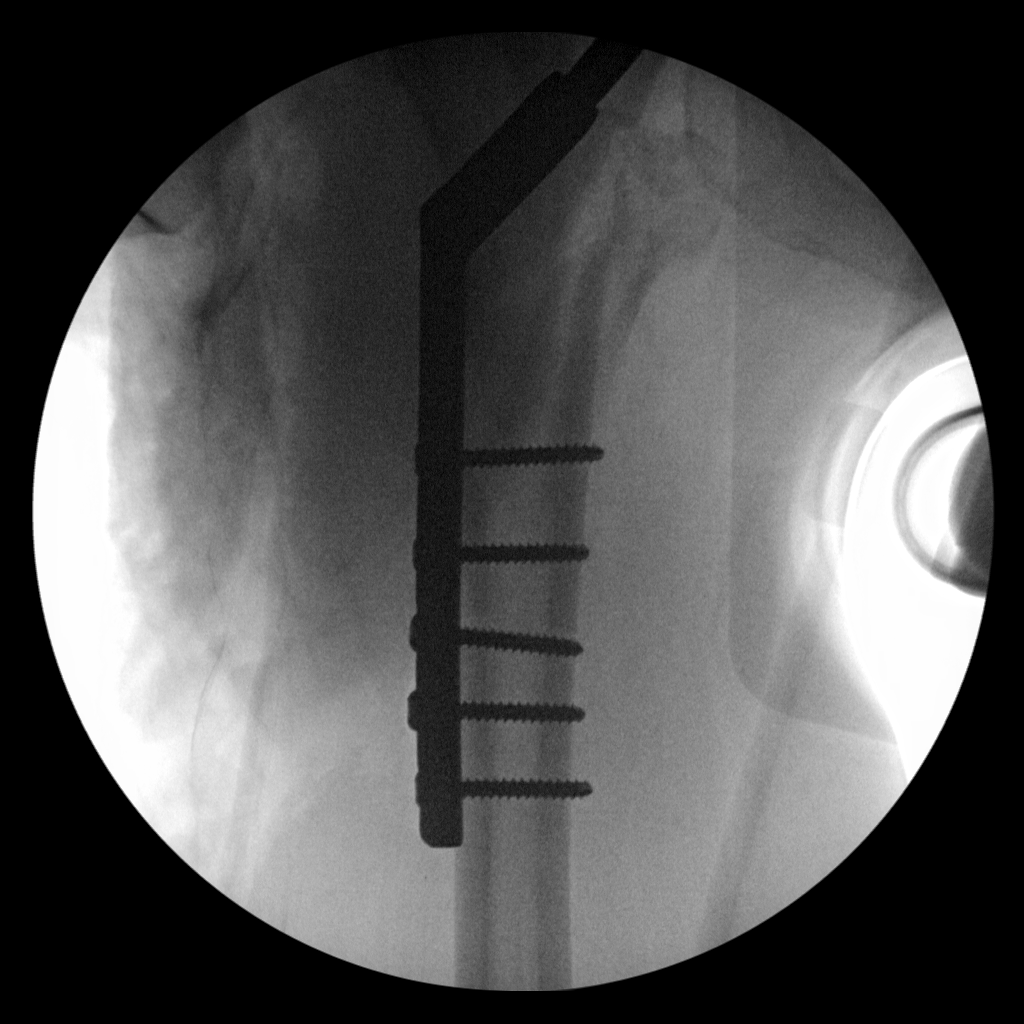
[im 2/2]
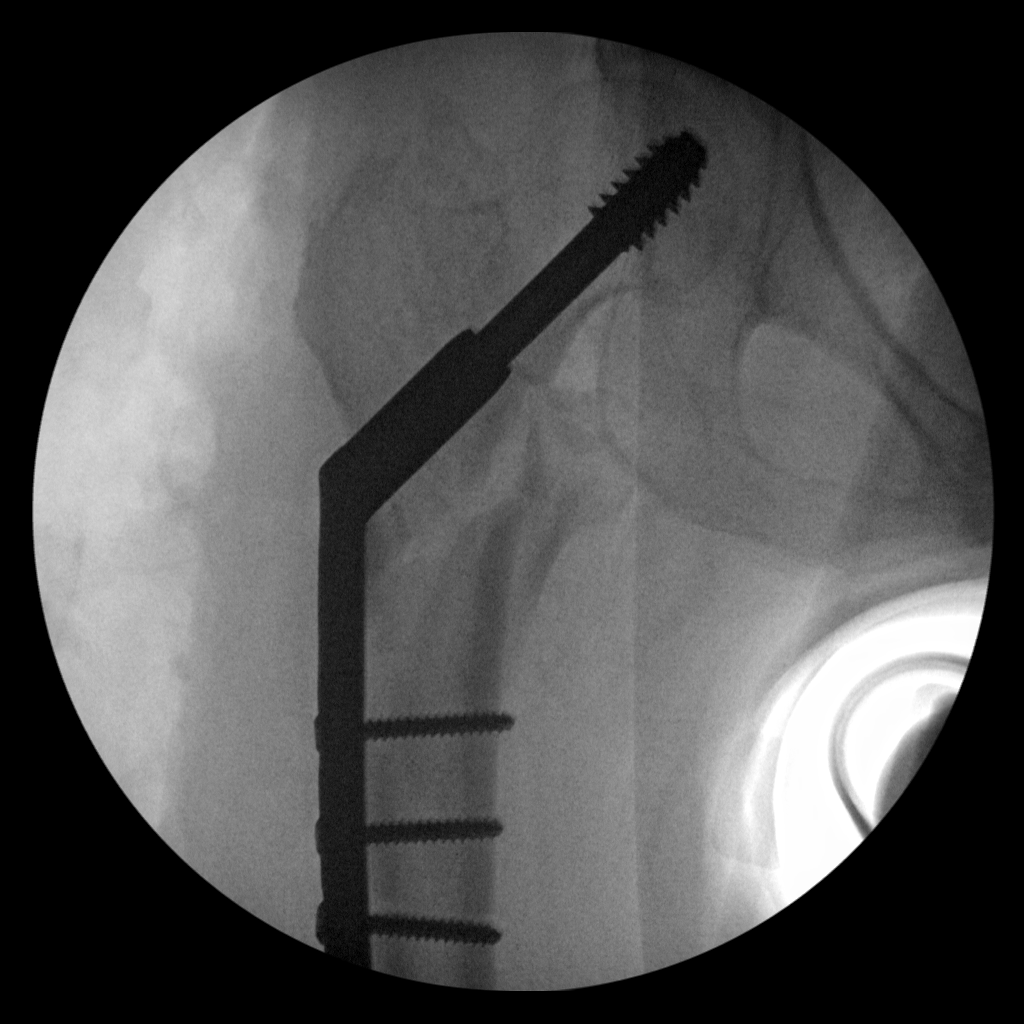

[2 of 2 positions shown; findings below may reference images not displayed]

FINDINGS: Previously seen short intra medullary nail and spiral blade have
been removed. No retained hardware is present. The patient has a new
dynamic hip screw in place. The new fixation hardware is intact. By
hardware localizes a intertrochanteric fracture with subtrochanteric
extension is again seen.
IMPRESSION: Revision of fracture fixation.  No acute abnormality.
